# Patient Record
Sex: Female | Born: 1995 | Race: White | Hispanic: No | Marital: Married | Smoking: Former smoker
Health system: Southern US, Community
[De-identification: ages and names within clinical notes are randomized; demographics above are authoritative.]

## PROBLEM LIST (undated history)

## (undated) DIAGNOSIS — L709 Acne, unspecified: Secondary | ICD-10-CM

## (undated) DIAGNOSIS — F32A Depression, unspecified: Secondary | ICD-10-CM

## (undated) DIAGNOSIS — F419 Anxiety disorder, unspecified: Secondary | ICD-10-CM

## (undated) HISTORY — PX: BREAST BIOPSY: SHX20

## (undated) HISTORY — DX: Acne, unspecified: L70.9

## (undated) HISTORY — PX: WISDOM TOOTH EXTRACTION: SHX21

---

## 2015-04-13 ENCOUNTER — Other Ambulatory Visit (HOSPITAL_COMMUNITY)
Admission: RE | Admit: 2015-04-13 | Discharge: 2015-04-13 | Disposition: A | Discharge status: Home / Self Care | Payer: Self-pay | Source: Ambulatory Visit | Attending: Gynecology | Admitting: Gynecology

## 2015-04-13 ENCOUNTER — Ambulatory Visit (INDEPENDENT_AMBULATORY_CARE_PROVIDER_SITE_OTHER): Payer: 59 | Admitting: Women's Health

## 2015-04-13 ENCOUNTER — Encounter: Payer: Self-pay | Admitting: Women's Health

## 2015-04-13 VITALS — BP 124/80 | Ht 63.0 in | Wt 122.0 lb

## 2015-04-13 DIAGNOSIS — Z30018 Encounter for initial prescription of other contraceptives: Secondary | ICD-10-CM

## 2015-04-13 DIAGNOSIS — Z01419 Encounter for gynecological examination (general) (routine) without abnormal findings: Secondary | ICD-10-CM

## 2015-04-13 DIAGNOSIS — N946 Dysmenorrhea, unspecified: Secondary | ICD-10-CM

## 2015-04-13 DIAGNOSIS — Z23 Encounter for immunization: Secondary | ICD-10-CM

## 2015-04-13 HISTORY — DX: Dysmenorrhea, unspecified: N94.6

## 2015-04-13 MED ORDER — ETONOGESTREL-ETHINYL ESTRADIOL 0.12-0.015 MG/24HR VA RING: VAGINAL_RING | VAGINAL | Status: DC

## 2015-04-13 NOTE — Patient Instructions (Signed)
Health Maintenance, Female Adopting a healthy lifestyle and getting preventive care can go a long way to promote health and wellness. Talk with your health care provider about what schedule of regular examinations is right for you. This is a good chance for you to check in with your provider about disease prevention and staying healthy. In between checkups, there are plenty of things you can do on your own. Experts have done a lot of research about which lifestyle changes and preventive measures are most likely to keep you healthy. Ask your health care provider for more information. WEIGHT AND DIET  Eat a healthy diet  Be sure to include plenty of vegetables, fruits, low-fat dairy products, and lean protein.  Do not eat a lot of foods high in solid fats, added sugars, or salt.  Get regular exercise. This is one of the most important things you can do for your health.  Most adults should exercise for at least 150 minutes each week. The exercise should increase your heart rate and make you sweat (moderate-intensity exercise).  Most adults should also do strengthening exercises at least twice a week. This is in addition to the moderate-intensity exercise.  Maintain a healthy weight  Body mass index (BMI) is a measurement that can be used to identify possible weight problems. It estimates body fat based on height and weight. Your health care provider can help determine your BMI and help you achieve or maintain a healthy weight.  For females 20 years of age and older:   A BMI below 18.5 is considered underweight.  A BMI of 18.5 to 24.9 is normal.  A BMI of 25 to 29.9 is considered overweight.  A BMI of 30 and above is considered obese.  Watch levels of cholesterol and blood lipids  You should start having your blood tested for lipids and cholesterol at 22 years of age, then have this test every 5 years.  You may need to have your cholesterol levels checked more often if:  Your lipid  or cholesterol levels are high.  You are older than 22 years of age.  You are at high risk for heart disease.  CANCER SCREENING   Lung Cancer  Lung cancer screening is recommended for adults 55-80 years old who are at high risk for lung cancer because of a history of smoking.  A yearly low-dose CT scan of the lungs is recommended for people who:  Currently smoke.  Have quit within the past 15 years.  Have at least a 30-pack-year history of smoking. A pack year is smoking an average of one pack of cigarettes a day for 1 year.  Yearly screening should continue until it has been 15 years since you quit.  Yearly screening should stop if you develop a health problem that would prevent you from having lung cancer treatment.  Breast Cancer  Practice breast self-awareness. This means understanding how your breasts normally appear and feel.  It also means doing regular breast self-exams. Let your health care provider know about any changes, no matter how small.  If you are in your 20s or 30s, you should have a clinical breast exam (CBE) by a health care provider every 1-3 years as part of a regular health exam.  If you are 40 or older, have a CBE every year. Also consider having a breast X-ray (mammogram) every year.  If you have a family history of breast cancer, talk to your health care provider about genetic screening.  If you   are at high risk for breast cancer, talk to your health care provider about having an MRI and a mammogram every year.  Breast cancer gene (BRCA) assessment is recommended for women who have family members with BRCA-related cancers. BRCA-related cancers include:  Breast.  Ovarian.  Tubal.  Peritoneal cancers.  Results of the assessment will determine the need for genetic counseling and BRCA1 and BRCA2 testing. Cervical Cancer Your health care provider may recommend that you be screened regularly for cancer of the pelvic organs (ovaries, uterus, and  vagina). This screening involves a pelvic examination, including checking for microscopic changes to the surface of your cervix (Pap test). You may be encouraged to have this screening done every 3 years, beginning at age 21.  For women ages 30-65, health care providers may recommend pelvic exams and Pap testing every 3 years, or they may recommend the Pap and pelvic exam, combined with testing for human papilloma virus (HPV), every 5 years. Some types of HPV increase your risk of cervical cancer. Testing for HPV may also be done on women of any age with unclear Pap test results.  Other health care providers may not recommend any screening for nonpregnant women who are considered low risk for pelvic cancer and who do not have symptoms. Ask your health care provider if a screening pelvic exam is right for you.  If you have had past treatment for cervical cancer or a condition that could lead to cancer, you need Pap tests and screening for cancer for at least 20 years after your treatment. If Pap tests have been discontinued, your risk factors (such as having a new sexual partner) need to be reassessed to determine if screening should resume. Some women have medical problems that increase the chance of getting cervical cancer. In these cases, your health care provider may recommend more frequent screening and Pap tests. Colorectal Cancer  This type of cancer can be detected and often prevented.  Routine colorectal cancer screening usually begins at 22 years of age and continues through 22 years of age.  Your health care provider may recommend screening at an earlier age if you have risk factors for colon cancer.  Your health care provider may also recommend using home test kits to check for hidden blood in the stool.  A small camera at the end of a tube can be used to examine your colon directly (sigmoidoscopy or colonoscopy). This is done to check for the earliest forms of colorectal  cancer.  Routine screening usually begins at age 50.  Direct examination of the colon should be repeated every 5-10 years through 22 years of age. However, you may need to be screened more often if early forms of precancerous polyps or small growths are found. Skin Cancer  Check your skin from head to toe regularly.  Tell your health care provider about any new moles or changes in moles, especially if there is a change in a mole's shape or color.  Also tell your health care provider if you have a mole that is larger than the size of a pencil eraser.  Always use sunscreen. Apply sunscreen liberally and repeatedly throughout the day.  Protect yourself by wearing long sleeves, pants, a wide-brimmed hat, and sunglasses whenever you are outside. HEART DISEASE, DIABETES, AND HIGH BLOOD PRESSURE   High blood pressure causes heart disease and increases the risk of stroke. High blood pressure is more likely to develop in:  People who have blood pressure in the high end   of the normal range (130-139/85-89 mm Hg).  People who are overweight or obese.  People who are African American.  If you are 38-23 years of age, have your blood pressure checked every 3-5 years. If you are 61 years of age or older, have your blood pressure checked every year. You should have your blood pressure measured twice--once when you are at a hospital or clinic, and once when you are not at a hospital or clinic. Record the average of the two measurements. To check your blood pressure when you are not at a hospital or clinic, you can use:  An automated blood pressure machine at a pharmacy.  A home blood pressure monitor.  If you are between 45 years and 39 years old, ask your health care provider if you should take aspirin to prevent strokes.  Have regular diabetes screenings. This involves taking a blood sample to check your fasting blood sugar level.  If you are at a normal weight and have a low risk for diabetes,  have this test once every three years after 22 years of age.  If you are overweight and have a high risk for diabetes, consider being tested at a younger age or more often. PREVENTING INFECTION  Hepatitis B  If you have a higher risk for hepatitis B, you should be screened for this virus. You are considered at high risk for hepatitis B if:  You were born in a country where hepatitis B is common. Ask your health care provider which countries are considered high risk.  Your parents were born in a high-risk country, and you have not been immunized against hepatitis B (hepatitis B vaccine).  You have HIV or AIDS.  You use needles to inject street drugs.  You live with someone who has hepatitis B.  You have had sex with someone who has hepatitis B.  You get hemodialysis treatment.  You take certain medicines for conditions, including cancer, organ transplantation, and autoimmune conditions. Hepatitis C  Blood testing is recommended for:  Everyone born from [birth year] through 1967.  Anyone with known risk factors for hepatitis C. Sexually transmitted infections (STIs)  You should be screened for sexually transmitted infections (STIs) including gonorrhea and chlamydia if:  You are sexually active and are younger than 22 years of age.  You are older than 22 years of age and your health care provider tells you that you are at risk for this type of infection.  Your sexual activity has changed since you were last screened and you are at an increased risk for chlamydia or gonorrhea. Ask your health care provider if you are at risk.  If you do not have HIV, but are at risk, it may be recommended that you take a prescription medicine daily to prevent HIV infection. This is called pre-exposure prophylaxis (PrEP). You are considered at risk if:  You are sexually active and do not regularly use condoms or know the HIV status of your partner(s).  You take drugs by injection.  You are sexually  active with a partner who has HIV. Talk with your health care provider about whether you are at high risk of being infected with HIV. If you choose to begin PrEP, you should first be tested for HIV. You should then be tested every 3 months for as long as you are taking PrEP.  PREGNANCY   If you are premenopausal and you may become pregnant, ask your health care provider about preconception counseling.  If you may  become pregnant, take 400 to 800 micrograms (mcg) of folic acid every day.  If you want to prevent pregnancy, talk to your health care provider about birth control (contraception). OSTEOPOROSIS AND MENOPAUSE   Osteoporosis is a disease in which the bones lose minerals and strength with aging. This can result in serious bone fractures. Your risk for osteoporosis can be identified using a bone density scan.  If you are 39 years of age or older, or if you are at risk for osteoporosis and fractures, ask your health care provider if you should be screened.  Ask your health care provider whether you should take a calcium or vitamin D supplement to lower your risk for osteoporosis.  Menopause may have certain physical symptoms and risks.  Hormone replacement therapy may reduce some of these symptoms and risks. Talk to your health care provider about whether hormone replacement therapy is right for you.  HOME CARE INSTRUCTIONS   Schedule regular health, dental, and eye exams.  Stay current with your immunizations.   Do not use any tobacco products including cigarettes, chewing tobacco, or electronic cigarettes.  If you are pregnant, do not drink alcohol.  If you are breastfeeding, limit how much and how often you drink alcohol.  Limit alcohol intake to no more than 1 drink per day for nonpregnant women. One drink equals 12 ounces of beer, 5 ounces of wine, or 1 ounces of hard liquor.  Do not use street drugs.  Do not share needles.  Ask your health care provider for help if  you need support or information about quitting drugs.  Tell your health care provider if you often feel depressed.  Tell your health care provider if you have ever been abused or do not feel safe at home.   This information is not intended to replace advice given to you by your health care provider. Make sure you discuss any questions you have with your health care provider.   Document Released: 08/29/2010 Document Revised: 03/06/2014 Document Reviewed: 01/15/2013 Elsevier Interactive Patient Education [employer]. Endometriosis Endometriosis is a condition in which the tissue that lines the uterus (endometrium) grows outside of its normal location. The tissue may grow in many locations close to the uterus, but it commonly grows on the ovaries, fallopian tubes, vagina, or bowel. Because the uterus expels, or sheds, its lining every menstrual cycle, there is bleeding wherever the endometrial tissue is located. This can cause pain because blood is irritating to tissues not normally exposed to it.  CAUSES  The cause of endometriosis is not known.  SIGNS AND SYMPTOMS  Often, there are no symptoms. When symptoms are present, they can vary with the location of the displaced tissue. Various symptoms can occur at different times. Although symptoms occur mainly during a woman's menstrual period, they can also occur midcycle and usually stop with menopause. Some people may go months with no symptoms at all. Symptoms may include:   Back or abdominal pain.   Heavier bleeding during periods.   Pain during intercourse.   Painful bowel movements.   Infertility. DIAGNOSIS  Your health care provider will do a physical exam and ask about your symptoms. Various tests may be done, such as:   Blood tests and urine tests. These are done to help rule out other problems.   Ultrasound. This test is done to look for abnormal tissue.   An X-ray of the lower bowel (barium enema).  Laparoscopy.  In this procedure, a thin, lighted tube  with a tiny camera on the end (laparoscope) is inserted into your abdomen. This helps your health care provider look for abnormal tissue to confirm the diagnosis. The health care provider may also remove a small piece of tissue (biopsy) from any abnormal tissue found. This tissue sample can then be sent to a lab so it can be looked at under a microscope. TREATMENT  Treatment will vary and may include:   Medicines to relieve pain. Nonsteroidal anti-inflammatory drugs (NSAIDs) are a type of pain medicine that can help to relieve the pain caused by endometriosis.  Hormonal therapy. When using hormonal therapy, periods are eliminated. This eliminates the monthly exposure to blood by the displaced endometrial tissue.   Surgery. Surgery may sometimes be done to remove the abnormal endometrial tissue. In severe cases, surgery may be done to remove the fallopian tubes, uterus, and ovaries (hysterectomy). HOME CARE INSTRUCTIONS   Take all medicines as directed by your health care provider. Do not take aspirin because it may increase bleeding when you are not on hormonal therapy.   Avoid activities that produce pain, including sexual activity. SEEK MEDICAL CARE IF:  You have pelvic pain before, after, or during your periods.  You have pelvic pain between periods that gets worse during your period.  You have pelvic pain during or after sex.  You have pelvic pain with bowel movements or urination, especially during your period.  You have problems getting pregnant.  You have a fever. SEEK IMMEDIATE MEDICAL CARE IF:   Your pain is severe and is not responding to pain medicine.   You have severe nausea and vomiting, or you cannot keep foods down.   You have pain that is limited to the right lower part of your abdomen.   You have swelling or increasing pain in your abdomen.   You see blood in your stool.  MAKE SURE YOU:   Understand these  instructions.  Will watch your condition.  Will get help right away if you are not doing well or get worse.   This information is not intended to replace advice given to you by your health care provider. Make sure you discuss any questions you have with your health care provider.   Document Released: 02/11/2000 Document Revised: 03/06/2014 Document Reviewed: 10/11/2012 Elsevier Interactive Patient Education 2016 [employer]. HPV (Human Papillomavirus) Vaccine--Gardasil-9:  1. Why get vaccinated? Gardasil-9 prevents human papillomavirus (HPV) types that cause many cancers, including:  cervical cancer in females,  vaginal and vulvar cancers in females,  anal cancer in females and males,  throat cancer in females and males, and  penile cancer in males. In addition, Gardasil-9 prevents HPV types that cause genital warts in both females and males. In the [place] about 12,000 women get cervical cancer every year, and about 4,000 women die from it. Gaylyn Lambert can prevent most of these cases of cervical cancer. Vaccination is not a substitute for cervical cancer screening. This vaccine does not protect against all HPV types that can cause cervical cancer. Women should still get regular Pap tests. HPV infection usually comes from sexual contact, and most people will become infected at some point in their life. About 14 million Americans, including teens, get infected every year. Most infections will go away and not cause serious problems. But thousands of women and men get cancer and diseases from HPV. 2. HPV vaccine Gaylyn Lambert is an FDA-approved HPV vaccine. It is recommended for both males and females. It is routinely given at 11 or 22  years of age, but it may be given beginning at age 41 years through age 107 years. Three doses of Gardasil-9 are recommended with the second dose given 1-2 months after the first dose and the third dose given 6 months after the first dose. 3. Some people should  not get this vaccine  Anyone who has had a severe, life-threatening allergic reaction to a dose of HPV vaccine should not get another dose.  Anyone who has a severe (life threatening) allergy to any component of HPV vaccine should not get the vaccine. Tell your doctor if you have any severe allergies that you know of, including a severe allergy to yeast.  HPV vaccine is not recommended for pregnant women. If you learn that you were pregnant when you were vaccinated, there is no reason to expect any problems for you or your baby. Any woman who learns she was pregnant when she got Gardasil-9 vaccine is encouraged to contact the manufacturer's registry for HPV vaccination during pregnancy at 3176423676. Women who are breastfeeding may be vaccinated.  If you have a mild illness, such as a cold, you can probably get the vaccine today. If you are moderately or severely ill, you should probably wait until you recover. Your doctor can advise you. 4. Risks of a vaccine reaction With any medicine, including vaccines, there is a chance of side effects. These are usually mild and go away on their own, but serious reactions are also possible. Most people who get HPV vaccine do not have any serious problems with it. Mild or moderate problems following Gardasil-9:  Reactions in the arm where the shot was given:  Soreness (about 9 people in 10)  Redness or swelling (about 1 person in 3)  Fever:  Mild (100F) (about 1 person in 10)  Moderate (102F) (about 1 person in 65)  Other problems:  Headache (about 1 person in 3) Problems that could happen after any injected vaccine:  People sometimes faint after a medical procedure, including vaccination. Sitting or lying down for about 15 minutes can help prevent fainting, and injuries caused by a fall. Tell your doctor if you feel dizzy, or have vision changes or ringing in the ears.  Some people get severe pain in the shoulder and have difficulty  moving the arm where a shot was given. This happens very rarely.  Any medication can cause a severe allergic reaction. Such reactions from a vaccine are very rare, estimated at about 1 in a million doses, and would happen within a few minutes to a few hours after the vaccination. As with any medicine, there is a very remote chance of a vaccine causing a serious injury or death. The safety of vaccines is always being monitored. For more information, visit: http://floyd.org/. 5. What if there is a serious reaction? What should I look for? Look for anything that concerns you, such as signs of a severe allergic reaction, very high fever, or unusual behavior. Signs of a severe allergic reaction can include hives, swelling of the face and throat, difficulty breathing, a fast heartbeat, dizziness, and weakness. These would usually start a few minutes to a few hours after the vaccination. What should I do? If you think it is a severe allergic reaction or other emergency that can't wait, call 9-1-1 or get to the nearest hospital. Otherwise, call your doctor. Afterward, the reaction should be reported to the "Vaccine Adverse Event Reporting System" (VAERS). Your doctor might file this report, or you can do it yourself  through the VAERS web site at www.vaers.SamedayNews.es, or by calling 4355290236. VAERS does not give medical advice. 6. The National Vaccine Injury Compensation Program The [employer] Vaccine Injury Compensation Program (VICP) is a federal program that was created to compensate people who may have been injured by certain vaccines. Persons who believe they may have been injured by a vaccine can learn about the program and about filing a claim by calling 8078578572 or visiting the [place] website at GoldCloset.com.ee. There is a time limit to file a claim for compensation. 7. How can I learn more?  Ask your health care provider. He or she can give you the vaccine package  insert or suggest other sources of information.  Call your local or state health department.  Contact the Centers for Disease Control and Prevention (CDC):  Call (816) 567-8470 (1-800-CDC-INFO) or  Visit CDC's website at http://sweeney-todd.com/ Vaccine Information Statement HPV Vaccine Gaylyn Lambert) 05/28/14   This information is not intended to replace advice given to you by your health care provider. Make sure you discuss any questions you have with your health care provider.   Document Released: 09/10/2013 Document Revised: 06/30/2014 Document Reviewed: 09/10/2013 Elsevier Interactive Patient Education [employer].

## 2015-04-13 NOTE — Progress Notes (Signed)
Gina Shepherd [birth year] [id number]    History:    Presents for annual exam.  History of regular monthly cycles was started on NuvaRing in September by  family doctor due to dysmenorrhea and rectal pain with menstrual cycles. Primary care felt she may have endometriosis. Virgin. Has had some relief of discomfort with continuous NuvaRing but has had some problems with spotting and questions if that is okay to have spotting. Has not received gardasil. Reports normal labs at primary care in September.  Past medical history, past surgical history, family history and social history were all reviewed and documented in the EPIC chart. 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 Parents healthy.  ROS:  A ROS was performed and pertinent positives and negatives are included.  Exam:  Filed Vitals:   04/13/15 1138  BP: 124/80    General appearance:  Normal Thyroid:  Symmetrical, normal in size, without palpable masses or nodularity. Respiratory  Auscultation:  Clear without wheezing or rhonchi Cardiovascular  Auscultation:  Regular rate, without rubs, murmurs or gallops  Edema/varicosities:  Not grossly evident Abdominal  Soft,nontender, without masses, guarding or rebound.  Liver/spleen:  No organomegaly noted  Hernia:  None appreciated  Skin  Inspection:  Grossly normal   Breasts: Examined lying and sitting.     Right: Without masses, retractions, discharge or axillary adenopathy.     Left: Without masses, retractions, discharge or axillary adenopathy. Gentitourinary   Inguinal/mons:  Normal without inguinal adenopathy  External genitalia:  Normal  BUS/Urethra/Skene's glands:  Normal  Vagina:  Normal  menses type blood  Cervix:  Normal  Uterus:   normal in size, shape and contour.  Midline and mobile  Adnexa/parametria:     Rt: Without masses or tenderness.   Lt: Without masses or tenderness.  Anus and perineum: Normal  Digital rectal exam: Normal sphincter tone without palpated masses or  tenderness  Assessment/Plan:  20 y.o. S WF Virgin for annual exam.    Dysmenorrhea with rectal pain/possible endometriosis Gardasil  Plan: Gardasil information given and reviewed first given instructed to schedule second in 2 months reviewed it is a 3 series injection. Endometriosis reviewed, best way to diagnose is laparoscopic surgery. Reviewed best to try medications initially will continue with the NuvaRing take continuously, if spotting becomes too problematic stop for 3-4 days have a cycle and replace. Reviewed importance of condoms if sexually active. SBE's, exercise, calcium rich diet, MVI daily encouraged. Campus safety reviewed. UA, Pap.    Harrington Challenger WHNP, 1:00 PM 04/13/2015

## 2015-04-14 LAB — URINALYSIS W MICROSCOPIC + REFLEX CULTURE
Bilirubin Urine: NEGATIVE
CASTS: NONE SEEN [LPF]
CRYSTALS: NONE SEEN [HPF]
Glucose, UA: NEGATIVE
Ketones, ur: NEGATIVE
Leukocytes, UA: NEGATIVE
Nitrite: NEGATIVE
Specific Gravity, Urine: 1.014 (ref 1.001–1.035)
WBC, UA: NONE SEEN WBC/HPF (ref <=5)
YEAST: NONE SEEN [HPF]
pH: 8 (ref 5.0–8.0)

## 2015-04-15 ENCOUNTER — Other Ambulatory Visit: Payer: Self-pay | Admitting: *Deleted

## 2015-04-15 LAB — CYTOLOGY - PAP

## 2015-04-15 MED ORDER — SULFAMETHOXAZOLE-TRIMETHOPRIM 800-160 MG PO TABS: 1.0000 | ORAL_TABLET | Freq: Two times a day (BID) | ORAL | Status: DC

## 2015-04-16 LAB — URINE CULTURE

## 2015-06-10 ENCOUNTER — Ambulatory Visit (INDEPENDENT_AMBULATORY_CARE_PROVIDER_SITE_OTHER): Payer: 59 | Admitting: *Deleted

## 2015-06-10 DIAGNOSIS — Z23 Encounter for immunization: Secondary | ICD-10-CM

## 2016-01-13 ENCOUNTER — Encounter (HOSPITAL_COMMUNITY): Payer: Self-pay | Admitting: Emergency Medicine

## 2016-01-13 ENCOUNTER — Emergency Department (HOSPITAL_COMMUNITY)
Admission: EM | Admit: 2016-01-13 | Discharge: 2016-01-13 | Disposition: A | Discharge status: Home / Self Care | Payer: Self-pay | Attending: Emergency Medicine | Admitting: Emergency Medicine

## 2016-01-13 DIAGNOSIS — Z87891 Personal history of nicotine dependence: Secondary | ICD-10-CM | POA: Insufficient documentation

## 2016-01-13 DIAGNOSIS — W4904XA Ring or other jewelry causing external constriction, initial encounter: Secondary | ICD-10-CM

## 2016-01-13 DIAGNOSIS — M79644 Pain in right finger(s): Secondary | ICD-10-CM | POA: Insufficient documentation

## 2016-01-13 DIAGNOSIS — S60449A External constriction of unspecified finger, initial encounter: Secondary | ICD-10-CM

## 2016-01-13 NOTE — ED Provider Notes (Signed)
[facility]-EMERGENCY DEPT Provider Note   CSN: [id number] Arrival date & time: 01/13/16  2223     History   Chief [id number] Chief Complaint  Patient presents with  . Hand Pain    HPI Gina Shepherd is a 20 y.o. female.  HPI Patient states she placed a ring she normally wears on her right fourth digit on her third fourth digit this morning. She was unable to remove the ring. No swelling or trauma. Patient attempted to use multiple lubricants but was unable to remove the ring. History reviewed. No pertinent past medical history.  Patient Active Problem List   Diagnosis Date Noted  . Dysmenorrhea 04/13/2015    Past Surgical History:  Procedure Laterality Date  . WISDOM TOOTH EXTRACTION      OB History    Gravida Para Term Preterm AB Living   0 0 0 0 0 0   SAB TAB Ectopic Multiple Live Births   0 0 0 0         Home Medications    Prior to Admission medications   Medication Sig Start Date End Date Taking? Authorizing Provider  Adapalene-Benzoyl Peroxide (EPIDUO) 0.1-2.5 % gel Apply topically as directed.    Historical Provider, MD  etonogestrel-ethinyl estradiol (NUVARING) 0.12-0.015 MG/24HR vaginal ring Insert vaginally and leave in place for 4 consecutive weeks, replace, if spotting > 2days remove for 3-4 days 04/13/15   Harrington ChallengerNancy J Young, NP  sulfamethoxazole-trimethoprim (BACTRIM DS,SEPTRA DS) 800-160 MG tablet Take 1 tablet by mouth 2 (two) times daily. 04/15/15   Harrington ChallengerNancy J Young, NP    Family History Family History  Problem Relation Age of Onset  . High Cholesterol Maternal Grandmother   . Endometriosis      Social History Social History  Substance Use Topics  . Smoking status: Former Games developermoker  . Smokeless tobacco: Never Used  . Alcohol use 0.0 oz/week     Comment: rare     Allergies   Patient has no known allergies.   Review of Systems Review of Systems  Skin: Negative for wound.  Neurological: Negative for numbness.  All other systems reviewed and are  negative.    Physical Exam Updated Vital Signs BP 138/84 (BP Location: Left Arm)   Pulse 60   Temp 97.8 F (36.6 C) (Oral)   Resp 16   Ht 5' 3.75" (1.619 m)   Wt 120 lb (54.4 kg)   LMP 12/26/2015 (Approximate)   SpO2 100%   BMI 20.76 kg/m   Physical Exam  Constitutional: She is oriented to person, place, and time. She appears well-developed and well-nourished.  HENT:  Head: Atraumatic.  Eyes: Pupils are equal, round, and reactive to light.  Neck: Normal range of motion.  Cardiovascular: Normal rate.   Pulmonary/Chest: Effort normal.  Abdominal: Soft. There is no tenderness. There is no rebound and no guarding.  Musculoskeletal: Normal range of motion. She exhibits no edema, tenderness or deformity.  Ring has been cut off by staff. Patient with no swelling, erythema or deformity of the third digit of the right hand. Full flexion and extension of the third digit. Good distal cap refill  Neurological: She is alert and oriented to person, place, and time.  Sensation intact. No motor deficits of the right hand.  Skin: Skin is warm and dry. Capillary refill takes less than 2 seconds. No rash noted. No erythema.  Psychiatric: She has a normal mood and affect. Her behavior is normal.  Nursing note and vitals reviewed.  ED Treatments / Results  Labs (all labs ordered are listed, but only abnormal results are displayed) Labs Reviewed - No data to display  EKG  EKG Interpretation None       Radiology No results found.  Procedures Procedures (including critical care time)  Medications Ordered in ED Medications - No data to display   Initial Impression / Assessment and Plan / ED Course  I have reviewed the triage vital signs and the nursing notes.  Pertinent labs & imaging results that were available during my care of the patient were reviewed by me and considered in my medical decision making (see chart for details).  Clinical Course     Ring was removed in  the emergency department. No obvious injury. Do not believe that imaging is necessary. Will discharge home.  Final Clinical Impressions(s) / ED Diagnoses   Final diagnoses:  Tight ring on finger    New Prescriptions New Prescriptions   No medications on file     Loren Raceravid Emberlynn Riggan, MD 01/13/16 2326

## 2016-01-13 NOTE — ED Triage Notes (Signed)
Pt has a ring stuck on her right hand middle finger  Pt states she has tried to get it off with soap, butter, ice, etc and cannot get it off

## 2016-01-13 NOTE — ED Notes (Signed)
Patient d/c'd self care.  F/U reviewed.  Patient verbalized understanding. 

## 2016-01-13 NOTE — ED Notes (Signed)
Patient left without signing D/C papers.

## 2016-04-14 ENCOUNTER — Encounter: Payer: Self-pay | Admitting: Women's Health

## 2016-04-14 ENCOUNTER — Ambulatory Visit (INDEPENDENT_AMBULATORY_CARE_PROVIDER_SITE_OTHER): Payer: 59 | Admitting: Women's Health

## 2016-04-14 VITALS — BP 120/70 | Ht 64.5 in | Wt 123.0 lb

## 2016-04-14 DIAGNOSIS — Z23 Encounter for immunization: Secondary | ICD-10-CM | POA: Diagnosis not present

## 2016-04-14 DIAGNOSIS — F3289 Other specified depressive episodes: Secondary | ICD-10-CM | POA: Diagnosis not present

## 2016-04-14 DIAGNOSIS — Z01419 Encounter for gynecological examination (general) (routine) without abnormal findings: Secondary | ICD-10-CM | POA: Diagnosis not present

## 2016-04-14 DIAGNOSIS — Z30018 Encounter for initial prescription of other contraceptives: Secondary | ICD-10-CM

## 2016-04-14 LAB — URINALYSIS W MICROSCOPIC + REFLEX CULTURE
BILIRUBIN URINE: NEGATIVE
Bacteria, UA: NONE SEEN [HPF]
CRYSTALS: NONE SEEN [HPF]
Casts: NONE SEEN [LPF]
GLUCOSE, UA: NEGATIVE
Hgb urine dipstick: NEGATIVE
KETONES UR: NEGATIVE
Leukocytes, UA: NEGATIVE
Nitrite: NEGATIVE
Protein, ur: NEGATIVE
Specific Gravity, Urine: 1.023 (ref 1.001–1.035)
WBC UA: NONE SEEN WBC/HPF (ref <=5)
Yeast: NONE SEEN [HPF]
pH: 7 (ref 5.0–8.0)

## 2016-04-14 NOTE — Patient Instructions (Signed)
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
## 2016-04-16 LAB — URINE CULTURE

## 2016-06-27 DIAGNOSIS — L709 Acne, unspecified: Secondary | ICD-10-CM

## 2016-06-27 HISTORY — DX: Acne, unspecified: L70.9

## 2017-04-17 ENCOUNTER — Ambulatory Visit (INDEPENDENT_AMBULATORY_CARE_PROVIDER_SITE_OTHER): Payer: Self-pay | Admitting: Women's Health

## 2017-04-17 ENCOUNTER — Encounter: Payer: Self-pay | Admitting: Women's Health

## 2017-04-17 VITALS — BP 110/76 | Ht 65.4 in | Wt 131.0 lb

## 2017-04-17 DIAGNOSIS — Z01419 Encounter for gynecological examination (general) (routine) without abnormal findings: Secondary | ICD-10-CM

## 2017-04-17 MED ORDER — ESCITALOPRAM OXALATE 10 MG PO TABS: 10.0000 mg | ORAL_TABLET | Freq: Every day | ORAL | 4 refills | Status: DC
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
## 2017-04-17 NOTE — Patient Instructions (Signed)

## 2017-06-05 ENCOUNTER — Other Ambulatory Visit: Payer: Self-pay | Admitting: Physician Assistant

## 2017-06-05 DIAGNOSIS — R202 Paresthesia of skin: Secondary | ICD-10-CM

## 2017-06-11 ENCOUNTER — Ambulatory Visit
Admission: RE | Admit: 2017-06-11 | Discharge: 2017-06-11 | Disposition: A | Discharge status: Home / Self Care | Payer: Self-pay | Source: Ambulatory Visit | Attending: Physician Assistant | Admitting: Physician Assistant

## 2017-06-11 DIAGNOSIS — R202 Paresthesia of skin: Secondary | ICD-10-CM

## 2017-06-11 MED ORDER — GADOBENATE DIMEGLUMINE 529 MG/ML IV SOLN
10.0000 mL | Freq: Once | INTRAVENOUS | Status: AC | PRN
Start: 2017-06-11 — End: 2017-06-11
  Administered 2017-06-11: 10 mL via INTRAVENOUS

## 2017-08-28 DIAGNOSIS — F411 Generalized anxiety disorder: Secondary | ICD-10-CM | POA: Diagnosis not present

## 2017-09-11 DIAGNOSIS — F411 Generalized anxiety disorder: Secondary | ICD-10-CM | POA: Diagnosis not present

## 2017-09-25 DIAGNOSIS — F411 Generalized anxiety disorder: Secondary | ICD-10-CM | POA: Diagnosis not present

## 2017-09-26 ENCOUNTER — Other Ambulatory Visit: Payer: Self-pay | Admitting: Family Medicine

## 2017-09-26 ENCOUNTER — Ambulatory Visit
Admission: RE | Admit: 2017-09-26 | Discharge: 2017-09-26 | Disposition: A | Discharge status: Home / Self Care | Payer: BLUE CROSS/BLUE SHIELD | Source: Ambulatory Visit | Attending: Family Medicine | Admitting: Family Medicine

## 2017-09-26 DIAGNOSIS — T1490XA Injury, unspecified, initial encounter: Secondary | ICD-10-CM

## 2017-09-26 DIAGNOSIS — S99912A Unspecified injury of left ankle, initial encounter: Secondary | ICD-10-CM | POA: Diagnosis not present

## 2017-09-26 DIAGNOSIS — M25572 Pain in left ankle and joints of left foot: Secondary | ICD-10-CM | POA: Diagnosis not present

## 2017-09-26 DIAGNOSIS — M7989 Other specified soft tissue disorders: Secondary | ICD-10-CM | POA: Diagnosis not present

## 2017-09-26 DIAGNOSIS — S93402A Sprain of unspecified ligament of left ankle, initial encounter: Secondary | ICD-10-CM | POA: Diagnosis not present

## 2017-10-09 DIAGNOSIS — F411 Generalized anxiety disorder: Secondary | ICD-10-CM | POA: Diagnosis not present

## 2017-11-07 DIAGNOSIS — F411 Generalized anxiety disorder: Secondary | ICD-10-CM | POA: Diagnosis not present

## 2017-11-07 DIAGNOSIS — F339 Major depressive disorder, recurrent, unspecified: Secondary | ICD-10-CM | POA: Diagnosis not present

## 2017-11-26 DIAGNOSIS — F411 Generalized anxiety disorder: Secondary | ICD-10-CM | POA: Diagnosis not present

## 2017-11-26 DIAGNOSIS — F339 Major depressive disorder, recurrent, unspecified: Secondary | ICD-10-CM | POA: Diagnosis not present

## 2017-11-28 ENCOUNTER — Other Ambulatory Visit: Payer: Self-pay | Admitting: Dermatology

## 2017-11-28 DIAGNOSIS — D485 Neoplasm of uncertain behavior of skin: Secondary | ICD-10-CM | POA: Diagnosis not present

## 2017-11-28 DIAGNOSIS — L709 Acne, unspecified: Secondary | ICD-10-CM | POA: Diagnosis not present

## 2017-12-12 DIAGNOSIS — F411 Generalized anxiety disorder: Secondary | ICD-10-CM | POA: Diagnosis not present

## 2017-12-12 DIAGNOSIS — F339 Major depressive disorder, recurrent, unspecified: Secondary | ICD-10-CM | POA: Diagnosis not present

## 2018-01-02 DIAGNOSIS — F339 Major depressive disorder, recurrent, unspecified: Secondary | ICD-10-CM | POA: Diagnosis not present

## 2018-01-02 DIAGNOSIS — F411 Generalized anxiety disorder: Secondary | ICD-10-CM | POA: Diagnosis not present

## 2018-01-07 DIAGNOSIS — L739 Follicular disorder, unspecified: Secondary | ICD-10-CM | POA: Diagnosis not present

## 2018-01-09 DIAGNOSIS — F411 Generalized anxiety disorder: Secondary | ICD-10-CM | POA: Diagnosis not present

## 2018-01-09 DIAGNOSIS — F339 Major depressive disorder, recurrent, unspecified: Secondary | ICD-10-CM | POA: Diagnosis not present

## 2018-01-16 DIAGNOSIS — H52223 Regular astigmatism, bilateral: Secondary | ICD-10-CM | POA: Diagnosis not present

## 2018-01-17 DIAGNOSIS — F411 Generalized anxiety disorder: Secondary | ICD-10-CM | POA: Diagnosis not present

## 2018-01-17 DIAGNOSIS — F339 Major depressive disorder, recurrent, unspecified: Secondary | ICD-10-CM | POA: Diagnosis not present

## 2018-01-30 DIAGNOSIS — F411 Generalized anxiety disorder: Secondary | ICD-10-CM | POA: Diagnosis not present

## 2018-01-30 DIAGNOSIS — F339 Major depressive disorder, recurrent, unspecified: Secondary | ICD-10-CM | POA: Diagnosis not present

## 2018-02-07 DIAGNOSIS — F411 Generalized anxiety disorder: Secondary | ICD-10-CM | POA: Diagnosis not present

## 2018-02-07 DIAGNOSIS — F339 Major depressive disorder, recurrent, unspecified: Secondary | ICD-10-CM | POA: Diagnosis not present

## 2018-02-15 DIAGNOSIS — F339 Major depressive disorder, recurrent, unspecified: Secondary | ICD-10-CM | POA: Diagnosis not present

## 2018-02-15 DIAGNOSIS — F411 Generalized anxiety disorder: Secondary | ICD-10-CM | POA: Diagnosis not present

## 2018-02-25 DIAGNOSIS — F411 Generalized anxiety disorder: Secondary | ICD-10-CM | POA: Diagnosis not present

## 2018-02-25 DIAGNOSIS — F339 Major depressive disorder, recurrent, unspecified: Secondary | ICD-10-CM | POA: Diagnosis not present

## 2018-03-06 DIAGNOSIS — F411 Generalized anxiety disorder: Secondary | ICD-10-CM | POA: Diagnosis not present

## 2018-03-06 DIAGNOSIS — F339 Major depressive disorder, recurrent, unspecified: Secondary | ICD-10-CM | POA: Diagnosis not present

## 2018-03-20 DIAGNOSIS — F411 Generalized anxiety disorder: Secondary | ICD-10-CM | POA: Diagnosis not present

## 2018-03-20 DIAGNOSIS — F339 Major depressive disorder, recurrent, unspecified: Secondary | ICD-10-CM | POA: Diagnosis not present

## 2018-03-28 DIAGNOSIS — R109 Unspecified abdominal pain: Secondary | ICD-10-CM | POA: Diagnosis not present

## 2018-03-28 DIAGNOSIS — F419 Anxiety disorder, unspecified: Secondary | ICD-10-CM | POA: Diagnosis not present

## 2018-04-04 DIAGNOSIS — F339 Major depressive disorder, recurrent, unspecified: Secondary | ICD-10-CM | POA: Diagnosis not present

## 2018-04-04 DIAGNOSIS — F411 Generalized anxiety disorder: Secondary | ICD-10-CM | POA: Diagnosis not present

## 2018-04-17 DIAGNOSIS — F411 Generalized anxiety disorder: Secondary | ICD-10-CM | POA: Diagnosis not present

## 2018-04-17 DIAGNOSIS — F339 Major depressive disorder, recurrent, unspecified: Secondary | ICD-10-CM | POA: Diagnosis not present

## 2018-04-22 ENCOUNTER — Encounter: Payer: Self-pay | Admitting: Women's Health

## 2018-04-22 ENCOUNTER — Ambulatory Visit: Payer: BLUE CROSS/BLUE SHIELD | Admitting: Women's Health

## 2018-04-22 VITALS — BP 122/78 | Ht 65.0 in | Wt 141.0 lb

## 2018-04-22 DIAGNOSIS — Z01419 Encounter for gynecological examination (general) (routine) without abnormal findings: Secondary | ICD-10-CM | POA: Diagnosis not present

## 2018-04-22 MED ORDER — ESCITALOPRAM OXALATE 10 MG PO TABS: 10.0000 mg | ORAL_TABLET | Freq: Every day | ORAL | 4 refills | Status: DC
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
## 2018-04-22 NOTE — Patient Instructions (Signed)

## 2018-04-23 LAB — PAP IG W/ RFLX HPV ASCU

## 2018-05-02 DIAGNOSIS — F411 Generalized anxiety disorder: Secondary | ICD-10-CM | POA: Diagnosis not present

## 2018-05-02 DIAGNOSIS — F339 Major depressive disorder, recurrent, unspecified: Secondary | ICD-10-CM | POA: Diagnosis not present

## 2018-07-10 ENCOUNTER — Telehealth: Payer: Self-pay

## 2018-07-10 NOTE — Telephone Encounter (Signed)
Patient complained of questionable UTI or vaginal infection. Reports some intermittent vag bleeding and dark urine first thing in the morning with "a burning in my low abdomen".  Sx began a few weeks ago and worsened in the last several days. Recommended office visit to assess. Appt scheduled for in the morning.

## 2018-07-11 ENCOUNTER — Encounter: Payer: Self-pay | Admitting: Obstetrics & Gynecology

## 2018-07-11 ENCOUNTER — Other Ambulatory Visit: Payer: Self-pay

## 2018-07-11 ENCOUNTER — Ambulatory Visit: Payer: BLUE CROSS/BLUE SHIELD | Admitting: Obstetrics & Gynecology

## 2018-07-11 VITALS — BP 122/70

## 2018-07-11 DIAGNOSIS — Z113 Encounter for screening for infections with a predominantly sexual mode of transmission: Secondary | ICD-10-CM

## 2018-07-11 DIAGNOSIS — N921 Excessive and frequent menstruation with irregular cycle: Secondary | ICD-10-CM

## 2018-07-11 DIAGNOSIS — R102 Pelvic and perineal pain: Secondary | ICD-10-CM | POA: Diagnosis not present

## 2018-07-11 DIAGNOSIS — N898 Other specified noninflammatory disorders of vagina: Secondary | ICD-10-CM

## 2018-07-11 DIAGNOSIS — R300 Dysuria: Secondary | ICD-10-CM | POA: Diagnosis not present

## 2018-07-11 LAB — WET PREP FOR TRICH, YEAST, CLUE

## 2018-07-11 NOTE — Addendum Note (Signed)
Addended by: Berna Spare A on: 07/11/2018 02:46 PM   Modules accepted: Orders
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
## 2018-07-12 DIAGNOSIS — F339 Major depressive disorder, recurrent, unspecified: Secondary | ICD-10-CM | POA: Diagnosis not present

## 2018-07-12 DIAGNOSIS — F411 Generalized anxiety disorder: Secondary | ICD-10-CM | POA: Diagnosis not present

## 2018-07-12 LAB — C. TRACHOMATIS/N. GONORRHOEAE RNA
C. trachomatis RNA, TMA: NOT DETECTED
N. gonorrhoeae RNA, TMA: NOT DETECTED

## 2018-07-12 LAB — URINALYSIS, COMPLETE W/RFL CULTURE
Bacteria, UA: NONE SEEN /HPF
Bilirubin Urine: NEGATIVE
Glucose, UA: NEGATIVE
Hgb urine dipstick: NEGATIVE
Hyaline Cast: NONE SEEN /LPF
Ketones, ur: NEGATIVE
Leukocyte Esterase: NEGATIVE
Nitrites, Initial: NEGATIVE
Protein, ur: NEGATIVE
RBC / HPF: NONE SEEN /HPF (ref 0–2)
Specific Gravity, Urine: 1.02 (ref 1.001–1.03)
WBC, UA: NONE SEEN /HPF (ref 0–5)
pH: 7 (ref 5.0–8.0)

## 2018-07-12 LAB — NO CULTURE INDICATED

## 2018-07-18 ENCOUNTER — Encounter: Payer: Self-pay | Admitting: *Deleted

## 2018-07-25 DIAGNOSIS — F411 Generalized anxiety disorder: Secondary | ICD-10-CM | POA: Diagnosis not present

## 2018-07-25 DIAGNOSIS — F339 Major depressive disorder, recurrent, unspecified: Secondary | ICD-10-CM | POA: Diagnosis not present

## 2018-08-19 DIAGNOSIS — F339 Major depressive disorder, recurrent, unspecified: Secondary | ICD-10-CM | POA: Diagnosis not present

## 2018-08-19 DIAGNOSIS — F411 Generalized anxiety disorder: Secondary | ICD-10-CM | POA: Diagnosis not present

## 2018-11-29 ENCOUNTER — Telehealth: Payer: Self-pay

## 2018-11-29 NOTE — Telephone Encounter (Signed)
Patient called stating that yesterday at the gym she was doing "split squats"  and felt a pinching feeling at "hip bone toward the center-hard to describe".  She assumed she pulled a muscle. When she got home and wiped she had red spotting and wiped it away with 3 wipes and no more since.  Today she has brownish discharge. She was supposed to ovulate yesterday she said.  I reassured her. Advised if this spotting continues she should schedule office visit. She is not having intercourse and is not concerned about STD or pregnancy.

## 2019-01-03 ENCOUNTER — Other Ambulatory Visit: Payer: Self-pay

## 2019-01-03 DIAGNOSIS — Z20822 Contact with and (suspected) exposure to covid-19: Secondary | ICD-10-CM

## 2019-01-04 LAB — NOVEL CORONAVIRUS, NAA: SARS-CoV-2, NAA: NOT DETECTED

## 2019-04-07 DIAGNOSIS — F411 Generalized anxiety disorder: Secondary | ICD-10-CM | POA: Diagnosis not present

## 2019-04-07 DIAGNOSIS — F339 Major depressive disorder, recurrent, unspecified: Secondary | ICD-10-CM | POA: Diagnosis not present

## 2019-04-25 DIAGNOSIS — Z0000 Encounter for general adult medical examination without abnormal findings: Secondary | ICD-10-CM | POA: Diagnosis not present

## 2019-04-25 DIAGNOSIS — F419 Anxiety disorder, unspecified: Secondary | ICD-10-CM | POA: Diagnosis not present

## 2019-04-30 ENCOUNTER — Other Ambulatory Visit: Payer: Self-pay

## 2019-04-30 ENCOUNTER — Encounter: Payer: Self-pay | Admitting: Women's Health

## 2019-04-30 ENCOUNTER — Ambulatory Visit: Payer: BC Managed Care – PPO | Admitting: Women's Health

## 2019-04-30 VITALS — BP 120/82 | Ht 65.0 in | Wt 129.0 lb

## 2019-04-30 DIAGNOSIS — Z01419 Encounter for gynecological examination (general) (routine) without abnormal findings: Secondary | ICD-10-CM | POA: Diagnosis not present

## 2019-04-30 MED ORDER — IBUPROFEN 600 MG PO TABS: 600.0000 mg | ORAL_TABLET | Freq: Three times a day (TID) | ORAL | 1 refills | Status: DC | PRN
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
## 2019-04-30 NOTE — Patient Instructions (Signed)
Good to see you today Best wishes on your upcoming marriage Multivitamin daily Health Maintenance, Female Adopting a healthy lifestyle and getting preventive care are important in promoting health and wellness. Ask your health care provider about:  The right schedule for you to have regular tests and exams.  Things you can do on your own to prevent diseases and keep yourself healthy. What should I know about diet, weight, and exercise? Eat a healthy diet   Eat a diet that includes plenty of vegetables, fruits, low-fat dairy products, and lean protein.  Do not eat a lot of foods that are high in solid fats, added sugars, or sodium. Maintain a healthy weight Body mass index (BMI) is used to identify weight problems. It estimates body fat based on height and weight. Your health care provider can help determine your BMI and help you achieve or maintain a healthy weight. Get regular exercise Get regular exercise. This is one of the most important things you can do for your health. Most adults should:  Exercise for at least 150 minutes each week. The exercise should increase your heart rate and make you sweat (moderate-intensity exercise).  Do strengthening exercises at least twice a week. This is in addition to the moderate-intensity exercise.  Spend less time sitting. Even light physical activity can be beneficial. Watch cholesterol and blood lipids Have your blood tested for lipids and cholesterol at 26 years of age, then have this test every 5 years. Have your cholesterol levels checked more often if:  Your lipid or cholesterol levels are high.  You are older than 26 years of age.  You are at high risk for heart disease. What should I know about cancer screening? Depending on your health history and family history, you may need to have cancer screening at various ages. This may include screening for:  Breast cancer.  Cervical cancer.  Colorectal cancer.  Skin cancer.  Lung  cancer. What should I know about heart disease, diabetes, and high blood pressure? Blood pressure and heart disease  High blood pressure causes heart disease and increases the risk of stroke. This is more likely to develop in people who have high blood pressure readings, are of African descent, or are overweight.  Have your blood pressure checked: ? Every 3-5 years if you are 36-4 years of age. ? Every year if you are 54 years old or older. Diabetes Have regular diabetes screenings. This checks your fasting blood sugar level. Have the screening done:  Once every three years after age 64 if you are at a normal weight and have a low risk for diabetes.  More often and at a younger age if you are overweight or have a high risk for diabetes. What should I know about preventing infection? Hepatitis B If you have a higher risk for hepatitis B, you should be screened for this virus. Talk with your health care provider to find out if you are at risk for hepatitis B infection. Hepatitis C Testing is recommended for:  Everyone born from [birth year] through 1967.  Anyone with known risk factors for hepatitis C. Sexually transmitted infections (STIs)  Get screened for STIs, including gonorrhea and chlamydia, if: ? You are sexually active and are younger than 26 years of age. ? You are older than 26 years of age and your health care provider tells you that you are at risk for this type of infection. ? Your sexual activity has changed since you were last screened, and you are  at increased risk for chlamydia or gonorrhea. Ask your health care provider if you are at risk.  Ask your health care provider about whether you are at high risk for HIV. Your health care provider may recommend a prescription medicine to help prevent HIV infection. If you choose to take medicine to prevent HIV, you should first get tested for HIV. You should then be tested every 3 months for as long as you are taking the  medicine. Pregnancy  If you are about to stop having your period (premenopausal) and you may become pregnant, seek counseling before you get pregnant.  Take 400 to 800 micrograms (mcg) of folic acid every day if you become pregnant.  Ask for birth control (contraception) if you want to prevent pregnancy. Osteoporosis and menopause Osteoporosis is a disease in which the bones lose minerals and strength with aging. This can result in bone fractures. If you are 48 years old or older, or if you are at risk for osteoporosis and fractures, ask your health care provider if you should:  Be screened for bone loss.  Take a calcium or vitamin D supplement to lower your risk of fractures.  Be given hormone replacement therapy (HRT) to treat symptoms of menopause. Follow these instructions at home: Lifestyle  Do not use any products that contain nicotine or tobacco, such as cigarettes, e-cigarettes, and chewing tobacco. If you need help quitting, ask your health care provider.  Do not use street drugs.  Do not share needles.  Ask your health care provider for help if you need support or information about quitting drugs. Alcohol use  Do not drink alcohol if: ? Your health care provider tells you not to drink. ? You are pregnant, may be pregnant, or are planning to become pregnant.  If you drink alcohol: ? Limit how much you use to 0-1 drink a day. ? Limit intake if you are breastfeeding.  Be aware of how much alcohol is in your drink. In the [place] one drink equals one 12 oz bottle of beer (355 mL), one 5 oz glass of wine (148 mL), or one 1 oz glass of hard liquor (44 mL). General instructions  Schedule regular health, dental, and eye exams.  Stay current with your vaccines.  Tell your health care provider if: ? You often feel depressed. ? You have ever been abused or do not feel safe at home. Summary  Adopting a healthy lifestyle and getting preventive care are important in  promoting health and wellness.  Follow your health care provider's instructions about healthy diet, exercising, and getting tested or screened for diseases.  Follow your health care provider's instructions on monitoring your cholesterol and blood pressure. This information is not intended to replace advice given to you by your health care provider. Make sure you discuss any questions you have with your health care provider. Document Revised: 02/06/2018 Document Reviewed: 02/06/2018 Elsevier Patient Education  2020 [employer].

## 2019-05-05 ENCOUNTER — Telehealth: Payer: Self-pay

## 2019-05-05 NOTE — Telephone Encounter (Signed)
Patient came in last week and saw [place].  Patient would like Rx for Phexxi. They discussed birth control and patient said she mentioned this to [place] but did not think she could afford it but she has found a coupon. (Never sexually active.Getting married this week.)  (Phexxi is a non-hormonal contraceptive gel that requires a prescription. It lowers the vagina's pH to the more acidic range, making it difficult for sperm to survive and reach the egg.)

## 2019-05-08 ENCOUNTER — Other Ambulatory Visit: Payer: Self-pay

## 2019-05-08 MED ORDER — PHEXXI 1.8-1-0.4 % VA GEL: 5.0000 g | VAGINAL | 6 refills | Status: DC | PRN

## 2019-05-08 NOTE — Telephone Encounter (Signed)
Agree with Phexxi.

## 2019-05-08 NOTE — Telephone Encounter (Signed)
Rx sent. Patient informed. 

## 2019-07-09 ENCOUNTER — Ambulatory Visit: Payer: Self-pay | Attending: Internal Medicine

## 2019-07-09 DIAGNOSIS — Z20822 Contact with and (suspected) exposure to covid-19: Secondary | ICD-10-CM

## 2019-07-10 ENCOUNTER — Telehealth: Payer: Self-pay | Admitting: *Deleted

## 2019-07-10 LAB — SARS-COV-2, NAA 2 DAY TAT

## 2019-07-10 LAB — NOVEL CORONAVIRUS, NAA: SARS-CoV-2, NAA: NOT DETECTED

## 2019-07-10 NOTE — Telephone Encounter (Signed)
Pt states she had direct household exposure to sister who tested covid positive Sunday. Questioning quarantine precautions. She has tested negative, yesterday. Questions answered to pts satisfaction, verbalizes understanding.

## 2019-08-11 ENCOUNTER — Ambulatory Visit (HOSPITAL_COMMUNITY)
Admission: EM | Admit: 2019-08-11 | Discharge: 2019-08-11 | Disposition: A | Discharge status: Home / Self Care | Payer: BC Managed Care – PPO | Attending: Family Medicine | Admitting: Family Medicine

## 2019-08-11 ENCOUNTER — Encounter (HOSPITAL_COMMUNITY): Payer: Self-pay

## 2019-08-11 ENCOUNTER — Other Ambulatory Visit: Payer: Self-pay

## 2019-08-11 DIAGNOSIS — R062 Wheezing: Secondary | ICD-10-CM | POA: Diagnosis not present

## 2019-08-11 MED ORDER — ALBUTEROL SULFATE HFA 108 (90 BASE) MCG/ACT IN AERS: 1.0000 | INHALATION_SPRAY | Freq: Four times a day (QID) | RESPIRATORY_TRACT | 0 refills | Status: DC | PRN

## 2019-08-11 MED ORDER — MONTELUKAST SODIUM 10 MG PO TABS: 10.0000 mg | ORAL_TABLET | Freq: Every day | ORAL | 0 refills | Status: DC

## 2019-08-11 MED ORDER — PREDNISONE 20 MG PO TABS: 20.0000 mg | ORAL_TABLET | Freq: Two times a day (BID) | ORAL | 0 refills | Status: DC

## 2019-08-11 NOTE — Discharge Instructions (Addendum)
Singulair is an allergy pill that helps prevent exercise-induced asthma and wheezing Take once a day in the evening  take prednisone 2 times a day for 5 days Drink plenty of fluids I have given you an albuterol inhaler to try prior to exercise to see if it reduces the wheezing and shortness of breath Consider a trial of Zyrtec 1 pill a day every day if the above does not help Follow-up with your primary care doctor

## 2019-08-11 NOTE — ED Provider Notes (Signed)
[facility]    CSN: [id number] Arrival date & time: 08/11/19  1906      History   Chief Complaint Chief Complaint  Patient presents with  . Cough  . Shortness of Breath    HPI Gina Shepherd is a 24 y.o. female.   HPI  Patient's had more coughing and wheezing ever since she moved into a new apartment in March She states that it is very dusty She is never had allergies before She thinks she may have an indoor allergen She tried to play basketball tonight and had wheezing She has had some shortness of breath sensation with her bad coughing spells She has never had wheezing before She has never had asthma She has never been a cigarette smoker She has tried cleaning the house and dusting, she has tried an Product manager and these have not helped She has not had any cough cold runny nose or infection symptoms.  She did have Covid in October 2020  Past Medical History:  Diagnosis Date  . Acne 06/2016   completed accutane treatment in 02/2017.    Patient Active Problem List   Diagnosis Date Noted  . Dysmenorrhea 04/13/2015    Past Surgical History:  Procedure Laterality Date  . WISDOM TOOTH EXTRACTION      OB History    Gravida  0   Para  0   Term  0   Preterm  0   AB  0   Living  0     SAB  0   TAB  0   Ectopic  0   Multiple  0   Live Births               Home Medications    Prior to Admission medications   Medication Sig Start Date End Date Taking? Authorizing Provider  albuterol (VENTOLIN HFA) 108 (90 Base) MCG/ACT inhaler Inhale 1-2 puffs into the lungs every 6 (six) hours as needed for wheezing or shortness of breath. 08/11/19   Eustace Moore, MD  escitalopram (LEXAPRO) 10 MG tablet Take 1 tablet (10 mg total) by mouth daily. 04/22/18   Harrington Challenger, NP  ibuprofen (ADVIL) 600 MG tablet Take 1 tablet (600 mg total) by mouth every 8 (eight) hours as needed. 04/30/19   Harrington Challenger, NP  Lactic Ac-Citric Ac-Pot Bitart  (PHEXXI) 1.8-1-0.4 % GEL Place 5 g vaginally as needed. 05/08/19   Genia Del, MD  Lactobacillus (PROBIOTIC ACIDOPHILUS PO) Take by mouth.    [provider]  montelukast (SINGULAIR) 10 MG tablet Take 1 tablet (10 mg total) by mouth at bedtime. 08/11/19   Eustace Moore, MD  Multiple Vitamin (MULTI-VITAMIN DAILY PO) Take by mouth.    [provider]  NON FORMULARY ashwaganda 2 tab daily    [provider]  predniSONE (DELTASONE) 20 MG tablet Take 1 tablet (20 mg total) by mouth 2 (two) times daily with a meal. 08/11/19   Eustace Moore, MD    Family History Family History  Problem Relation Age of Onset  . High Cholesterol Maternal Grandmother   . Endometriosis Other     Social History Social History   Tobacco Use  . Smoking status: Former Games developer  . Smokeless tobacco: Never Used  Vaping Use  . Vaping Use: Never used  Substance Use Topics  . Alcohol use: Yes    Alcohol/week: 0.0 standard drinks    Comment: rare  . Drug use: No  Allergies   Epiduo [adapalene-benzoyl peroxide]   Review of Systems Review of Systems  Respiratory: Positive for cough, shortness of breath and wheezing.      Physical Exam Triage Vital Signs ED Triage Vitals  Enc Vitals Group     BP 08/11/19 2008 124/71     Pulse Rate 08/11/19 2008 64     Resp 08/11/19 2008 14     Temp 08/11/19 2008 97.9 F (36.6 C)     Temp Source 08/11/19 2008 Oral     SpO2 08/11/19 2008 100 %     Weight --      Height --      Head Circumference --      Peak Flow --      Pain Score 08/11/19 2007 0     Pain Loc --      Pain Edu? --      Excl. in [place]? --    No data found.  Updated Vital Signs BP 124/71 (BP Location: Right Arm)   Pulse 64   Temp 97.9 F (36.6 C) (Oral)   Resp 14   LMP 08/08/2019 (Exact Date)   SpO2 100%     Physical Exam Constitutional:      General: She is not in acute distress.    Appearance: She is well-developed.  HENT:     Head:  Normocephalic and atraumatic.     Mouth/Throat:     Mouth: Mucous membranes are moist.  Eyes:     Conjunctiva/sclera: Conjunctivae normal.     Pupils: Pupils are equal, round, and reactive to light.  Cardiovascular:     Rate and Rhythm: Normal rate and regular rhythm.     Heart sounds: Normal heart sounds.  Pulmonary:     Effort: Pulmonary effort is normal. No respiratory distress.     Breath sounds: No decreased breath sounds.     Comments: Lungs are clear throughout Abdominal:     General: There is no distension.     Palpations: Abdomen is soft.  Musculoskeletal:        General: Normal range of motion.     Cervical back: Normal range of motion.  Skin:    General: Skin is warm and dry.  Neurological:     Mental Status: She is alert.  Psychiatric:        Mood and Affect: Mood normal.        Behavior: Behavior normal.      UC Treatments / Results  Labs (all labs ordered are listed, but only abnormal results are displayed) Labs Reviewed - No data to display  EKG   Radiology No results found.  Procedures Procedures (including critical care time)  Medications Ordered in UC Medications - No data to display  Initial Impression / Assessment and Plan / UC Course  I have reviewed the triage vital signs and the nursing notes.  Pertinent labs & imaging results that were available during my care of the patient were reviewed by me and considered in my medical decision making (see chart for details).     Patient with previously no allergies, no asthma, no smoking or wheezing it is unusual for her to have exercise-induced asthma.  This may be a result of a viral bronchial infection or possibly an allergic reaction.  We will treat for allergies and have her follow-up with her primary care    Final Clinical Impressions(s) / UC Diagnoses   Final diagnoses:  Wheezing     Discharge Instructions  Singulair is an allergy pill that helps prevent exercise-induced asthma  and wheezing Take once a day in the evening  take prednisone 2 times a day for 5 days Drink plenty of fluids I have given you an albuterol inhaler to try prior to exercise to see if it reduces the wheezing and shortness of breath Consider a trial of Zyrtec 1 pill a day every day if the above does not help Follow-up with your primary care doctor    ED Prescriptions    Medication Sig Dispense Auth. Provider   montelukast (SINGULAIR) 10 MG tablet Take 1 tablet (10 mg total) by mouth at bedtime. 30 tablet Eustace Moore, MD   predniSONE (DELTASONE) 20 MG tablet Take 1 tablet (20 mg total) by mouth 2 (two) times daily with a meal. 10 tablet Eustace Moore, MD   albuterol (VENTOLIN HFA) 108 (90 Base) MCG/ACT inhaler Inhale 1-2 puffs into the lungs every 6 (six) hours as needed for wheezing or shortness of breath. 18 g Eustace Moore, MD     PDMP not reviewed this encounter.   Eustace Moore, MD 08/11/19 2027

## 2019-08-11 NOTE — ED Triage Notes (Addendum)
Reports cough and SOB, that is worse at night. Reports relief when laying on her abdomen. Began sleeping with a humidifier and dusting weekly. Started to feel better, but upon playing basketball this evening, she states "I was wheezing after five minutes". Reports scant amount of dark tan phlegm. Has not been taking antihistamines or allergy medication.   Pt reports she had covid in October 2020

## 2019-08-20 DIAGNOSIS — Z23 Encounter for immunization: Secondary | ICD-10-CM | POA: Diagnosis not present

## 2019-08-26 DIAGNOSIS — R0602 Shortness of breath: Secondary | ICD-10-CM | POA: Diagnosis not present

## 2019-08-27 ENCOUNTER — Ambulatory Visit
Admission: RE | Admit: 2019-08-27 | Discharge: 2019-08-27 | Disposition: A | Discharge status: Home / Self Care | Payer: BC Managed Care – PPO | Source: Ambulatory Visit | Attending: Physician Assistant | Admitting: Physician Assistant

## 2019-08-27 ENCOUNTER — Other Ambulatory Visit: Payer: Self-pay | Admitting: Physician Assistant

## 2019-08-27 DIAGNOSIS — R0602 Shortness of breath: Secondary | ICD-10-CM

## 2019-08-27 DIAGNOSIS — R062 Wheezing: Secondary | ICD-10-CM

## 2020-03-16 ENCOUNTER — Other Ambulatory Visit: Payer: Self-pay

## 2020-03-16 ENCOUNTER — Encounter: Payer: Self-pay | Admitting: Allergy

## 2020-03-16 ENCOUNTER — Ambulatory Visit: Payer: 59 | Admitting: Allergy

## 2020-03-16 VITALS — BP 118/70 | HR 82 | Temp 91.0°F | Resp 18 | Ht 66.14 in | Wt 142.4 lb

## 2020-03-16 DIAGNOSIS — R12 Heartburn: Secondary | ICD-10-CM

## 2020-03-16 DIAGNOSIS — J3089 Other allergic rhinitis: Secondary | ICD-10-CM | POA: Insufficient documentation

## 2020-03-16 DIAGNOSIS — J45909 Unspecified asthma, uncomplicated: Secondary | ICD-10-CM

## 2020-03-16 HISTORY — DX: Heartburn: R12

## 2020-03-16 HISTORY — DX: Other allergic rhinitis: J30.89

## 2020-03-16 MED ORDER — FLUTICASONE PROPIONATE 50 MCG/ACT NA SUSP: 1.0000 | Freq: Two times a day (BID) | NASAL | 5 refills | Status: DC | PRN

## 2020-03-16 NOTE — Patient Instructions (Addendum)
Today's skin testing showed: Positive to dust mites only. Negative to common foods.   Breathing:  Breathing test showed slight improvement after treatment. Daily controller medication(s): Start Alvesco 1 puff twice a day with spacer and rinse mouth afterwards for 1 month. Sample given and demonstrated proper use. If this inhaler works well for you, let me know and I will send in a prescription.  Spacer given and demonstrated proper use with inhaler. Patient understood technique and all questions/concerned were addressed.  May use albuterol rescue inhaler 2 puffs every 4 to 6 hours as needed for shortness of breath, chest tightness, coughing, and wheezing. May use albuterol rescue inhaler 2 puffs 5 to 15 minutes prior to strenuous physical activities. Monitor frequency of use.  Breathing control goals:  Full participation in all desired activities (may need albuterol before activity) Albuterol use two times or less a week on average (not counting use with activity) Cough interfering with sleep two times or less a month Oral steroids no more than once a year No hospitalizations  Environmental allergies  Start environmental control measures as below.  May use over the counter antihistamines such as Zyrtec (cetirizine), Claritin (loratadine), Allegra (fexofenadine), or Xyzal (levocetirizine) daily as needed.  Continue Singulair (montelukast) 10mg  daily at night.  May use Flonase (fluticasone) nasal spray 1 spray per nostril twice a day as needed for nasal congestion.   Heartburn:  See below for lifestyle modifications.  Follow up in 2 months or sooner if needed.   Control of House Dust Mite Allergen . Dust mite allergens are a common trigger of allergy and asthma symptoms. While they can be found throughout the house, these microscopic creatures thrive in warm, humid environments such as bedding, upholstered furniture and carpeting. . Because so much time is spent in the  bedroom, it is essential to reduce mite levels there.  . Encase pillows, mattresses, and box springs in special allergen-proof fabric covers or airtight, zippered plastic covers.  . Bedding should be washed weekly in hot water (130 F) and dried in a hot dryer. Allergen-proof covers are available for comforters and pillows that can't be regularly washed.  the allergy-proof covers every few months. Minimize clutter in the bedroom. Keep pets out of the bedroom.  Gina Shepherd Keep humidity less than 50% by using a dehumidifier or air conditioning. You can buy a humidity measuring device called a hygrometer to monitor this.  . If possible, replace carpets with hardwood, linoleum, or washable area rugs. If that's not possible, vacuum frequently with a vacuum that has a HEPA filter. . Remove all upholstered furniture and non-washable window drapes from the bedroom. . Remove all non-washable stuffed toys from the bedroom.  Wash stuffed toys weekly.  Heartburn Heartburn is a type of pain or discomfort that can happen in your throat or chest. It is often described as a burning pain. It may also cause a bad, acid-like taste in your mouth. It may be caused by stomach contents that move back up (reflux) into the part of the body that moves food from your mouth to your stomach (esophagus). Heartburn may feel worse:  When you lie down.  When you bend over.  At night. Follow these instructions at home: Eating and drinking  Avoid certain foods and drinks as told by your doctor. This may include: ? Coffee and tea, with or without caffeine. ? Drinks that have alcohol. ? Energy drinks and sports drinks. ? Carbonated drinks or sodas. ? Chocolate and cocoa. ?  Peppermint and mint flavorings. ? Garlic and onions. ? Horseradish. ? Spicy and acidic foods, such as:  Peppers.  Chili powder and curry powder.  Vinegar.  Hot sauces and BBQ sauce. ? Citrus fruit juices and citrus fruits, such  as:  Oranges.  Lemons.  Limes. ? Tomato-based foods, such as:  Red sauce and pizza with red sauce.  Chili.  Salsa. ? Fried and fatty foods, such as:  Donuts.  [place] fries and potato chips.  High-fat dressings. ? High-fat meats, such as:  Hot dogs and sausage.  Rib eye steak.  Ham and bacon. ? High-fat dairy items, such as:  Whole milk.  Butter.  Cream cheese.  Eat small meals often. Avoid eating large meals.  Avoid drinking large amounts of liquid with your meals.  Avoid eating meals during the 2-3 hours before bedtime.  Avoid lying down right after you eat.  Do not exercise right after you eat.   Lifestyle  If you are overweight, lose an amount of weight that is healthy for you. Ask your doctor about a safe weight loss goal.  Do not smoke or use any products that contain nicotine or tobacco. These can make your symptoms worse. If you need help quitting, ask your doctor.  Wear loose clothes. Do not wear anything tight around your waist.  Raise (elevate) the head of your bed about 6 inches (15 cm) when you sleep. You can use a wedge to do this.  Try to lower your stress. If you need help doing this, ask your doctor.      Medicines  Take over-the-counter and prescription medicines only as told by your doctor.  Do not take aspirin or NSAIDs, such as ibuprofen, unless your doctor says it is okay.  Stop medicines only as told by your doctor. If you stop taking some medicines too quickly, your symptoms may get worse. General instructions  Watch for any changes in your symptoms.  Keep all follow-up visits. Contact a doctor if:  You have new symptoms.  You lose weight and you do not know why.  You have trouble swallowing, or it hurts to swallow.  You have wheezing or a cough that keeps happening.  Your symptoms do not get better with treatment.  You have heartburn often for more than 2 weeks. Get help right away if:  You have pain in your  arms, neck, jaw, teeth, or back all of a sudden.  You feel sweaty, dizzy, or light-headed all of a sudden.  You have chest pain or shortness of breath.  You vomit and your vomit looks like blood or coffee grounds.  Your poop (stool) is bloody or black. These symptoms may be an emergency. Get help right away. Call your local emergency services (911 in the [place]  Do not wait to see if the symptoms will go away.  Do not drive yourself to the hospital. Summary  Heartburn is a type of pain that can happen in your throat or chest. It can feel like a burning pain. It may also cause a bad, acid-like taste in your mouth.  You may need to avoid certain foods and drinks to help your symptoms. Ask your doctor what foods and drinks you should avoid.  Take over-the-counter and prescription medicines only as told by your doctor. Do not take aspirin or NSAIDs, such as ibuprofen, unless your doctor told you to do so.  Contact your doctor if your symptoms do not get better or they get worse. This  information is not intended to replace advice given to you by your health care provider. Make sure you discuss any questions you have with your health care provider. Document Revised: 08/20/2019 Document Reviewed: 08/20/2019 Elsevier Patient Education  2021 [employer].

## 2020-03-16 NOTE — Assessment & Plan Note (Signed)
Rhinitis symptoms for less than 1 year with worsening in the spring and fall.  Tried Zyrtec with good benefit.  No previous allergy/ENT evaluation.  Today's skin testing was positive to dust mites only. Negative to common foods.  Start environmental control measures as below.  May use over the counter antihistamines such as Zyrtec (cetirizine), Claritin (loratadine), Allegra (fexofenadine), or Xyzal (levocetirizine) daily as needed.  Continue Singulair (montelukast) 10mg  daily at night.  May use Flonase (fluticasone) nasal spray 1 spray per nostril twice a day as needed for nasal congestion.

## 2020-03-16 NOTE — Assessment & Plan Note (Signed)
Some heartburn symptoms and takes OTC medications prn with good benefit.  See below for lifestyle modifications.  Uncontrolled reflux/heartburn can also worsen respiratory symptoms. If no improvement with above regimen, will do a trial of PPI next.

## 2020-03-16 NOTE — Progress Notes (Signed)
New Patient Note  RE: Gina Shepherd MRN: [id number] DOB: 04/23/1995 Date of Office Visit: 03/16/2020  Referring provider: Milus Heightedmon, Noelle, PA Primary care provider: Milus Heightedmon, Noelle, PA  Chief Complaint: Breathing Problem  History of Present Illness: I had the pleasure of seeing Gina JourneyMaddison Shepherd for initial evaluation at the [facility] on 03/16/2020. She is a 25 y.o. female, who is self-referred here for the evaluation of asthma and allergies.   Patient had COVID-19 in October 2020. But in March 2021, patient noted breathing issues daily for a few months. She was yawning a lot and had to take deep breaths which was worse at night.  She was playing at a basketball game in the summer 2021 and started wheezing. [facility] and [facility] told she had an asthmatic reaction. No prior history of asthma/inhaler use.   Now still having symptoms on and off but mainly at night.   She reports symptoms of chest tightness, shortness of breath, coughing, wheezing for <1 years. Current medications include albuterol prn and Singulair daily which help. She reports not using aerochamber with inhalers. She tried the following inhalers: none. Main triggers are exertion and unknown. In the last month, frequency of symptoms: depends. Frequency of nocturnal symptoms: 0x/month. Frequency of SABA use: <0x/week. Interference with physical activity: sometimes. Sleep is undisturbed. In the last 12 months, emergency room visits/urgent care visits/doctor office visits or hospitalizations due to respiratory issues: 2. In the last 12 months, oral steroids courses: one course with some benefit. Lifetime history of hospitalization for respiratory issues: no. Prior intubations: no. History of pneumonia: as a child. She was not evaluated by allergist/pulmonologist in the past. Smoking exposure: no. Up to date with flu vaccine: no. Up to date with COVID-19 vaccine: no.  History of reflux: takes OTC medications as  needed.  CXR 08/27/2019: IMPRESSION: Mild central bronchial thickening which can be seen with asthma.  She reports symptoms of rhinorrhea, nasal congestion. Symptoms have been going on for <1 years. The symptoms are present all year around with worsening in spring and fall. Other triggers include exposure to unknown. Anosmia: no. Headache: sometimes. She has used zyrtec with fair improvement in symptoms. Sinus infections: no. Previous work up includes: none. Previous ENT evaluation: no. Previous sinus imaging: no. History of nasal polyps: no.  08/11/2019 [facility] visit: "HPI  Patient's had more coughing and wheezing ever since she moved into a new apartment in March She states that it is very dusty She is never had allergies before She thinks she may have an indoor allergen She tried to play basketball tonight and had wheezing She has had some shortness of breath sensation with her bad coughing spells She has never had wheezing before She has never had asthma She has never been a cigarette smoker She has tried cleaning the house and dusting, she has tried an Product managerair purifier and these have not helped She has not had any cough cold runny nose or infection symptoms.  She did have Covid in October 2020"  Assessment and Plan: Gina Shepherd is a 25 y.o. female with: Reactive airway disease without complication COVID-19 infection in October 2020 and started having issues with her breathing in March 2021 with chest tightness, shortness of breath, coughing, wheezing, and yawning especially at night.  1 episode of wheezing after playing a basketball game in June 2021. CXR at that time concerning for asthma. No prior diagnosis of asthma. Uses albuterol as needed and Singulair daily with some benefit.  1 course of prednisone.   Today's spirometry was normal with 6% improvement in FEV1 post bronchodilator treatment.   Some concern for reactive airway disease post COVID-19 infection.  Daily controller  medication(s): Start Alvesco 1 puff twice a day with spacer and rinse mouth afterwards for 1 month. Sample given and demonstrated proper use. If this inhaler works well for you, let me know and I will send in a prescription.  Spacer given and demonstrated proper use with inhaler. Patient understood technique and all questions/concerned were addressed.   Continue Singulair (montelukast) 10mg  daily at night. May use albuterol rescue inhaler 2 puffs every 4 to 6 hours as needed for shortness of breath, chest tightness, coughing, and wheezing. May use albuterol rescue inhaler 2 puffs 5 to 15 minutes prior to strenuous physical activities. Monitor frequency of use.  Repeat spirometry at next visit.   Other allergic rhinitis Rhinitis symptoms for less than 1 year with worsening in the spring and fall.  Tried Zyrtec with good benefit.  No previous allergy/ENT evaluation.  Today's skin testing was positive to dust mites only. Negative to common foods.  Start environmental control measures as below.  May use over the counter antihistamines such as Zyrtec (cetirizine), Claritin (loratadine), Allegra (fexofenadine), or Xyzal (levocetirizine) daily as needed.  Continue Singulair (montelukast) 10mg  daily at night.  May use Flonase (fluticasone) nasal spray 1 spray per nostril twice a day as needed for nasal congestion.   Heartburn Some heartburn symptoms and takes OTC medications prn with good benefit.  See below for lifestyle modifications.  Uncontrolled reflux/heartburn can also worsen respiratory symptoms. If no improvement with above regimen, will do a trial of PPI next.   Return in about 2 months (around 05/14/2020).  Meds ordered this encounter  Medications  . fluticasone (FLONASE) 50 MCG/ACT nasal spray    Sig: Place 1 spray into both nostrils 2 (two) times daily as needed.    Dispense:  16 g    Refill:  5   Other allergy screening: Food allergy: no Medication allergy: yes   epiduo topical - swelling Hymenoptera allergy: no Urticaria: no Eczema:no History of recurrent infections suggestive of immunodeficency: no  Diagnostics: Spirometry:  Tracings reviewed. Her effort: Good reproducible efforts. FVC: 3.15L FEV1: 2.70L, 83% predicted FEV1/FVC ratio: 86% Interpretation: Spirometry consistent with normal pattern with 6% improvement in FEV1 post bronchodilator treatment.  Please see scanned spirometry results for details.  Skin Testing: Environmental allergy panel and select foods.  Positive test to: dust mites.  Results discussed with patient/family.  Pediatric Percutaneous Testing - 03/16/20 1523    Location Back          Airborne Adult Perc - 03/16/20 1451    Time Antigen Placed 1451    Allergen Manufacturer 03/18/20    Location Back    Number of Test 59    1. Control-Buffer 50% Glycerol Negative    2. Control-Histamine 1 mg/ml 2+    3. Albumin saline Negative    4. Bahia Negative    5. 03/18/20 Negative    6. Johnson Negative    7. Kentucky Blue Negative    8. Meadow Fescue Negative    9. Perennial Rye Negative    10. Sweet Vernal Negative    11. Timothy Negative    12. Cocklebur Negative    13. Burweed Marshelder Negative    14. Ragweed, short Negative    15. Ragweed, Giant Negative    16. Plantain,  English Negative  17. Lamb's Quarters Negative    18. Sheep Sorrell Negative    19. Rough Pigweed Negative    20. Marsh Elder, Rough Negative    21. Mugwort, Common Negative    22. Ash mix Negative    23. Birch mix Negative    24. Beech American Negative    25. Box, Elder Negative    26. Cedar, red Negative    27. Cottonwood, Guinea-Bissau Negative    28. Elm mix Negative    29. Hickory Negative    30. Maple mix Negative    31. Oak, Guinea-Bissau mix Negative    32. Pecan Pollen Negative    33. Pine mix Negative    34. Sycamore Eastern Negative    35. Walnut, Black Pollen Negative    36. Alternaria alternata Negative    37. Cladosporium  Herbarum Negative    38. Aspergillus mix Negative    39. Penicillium mix Negative    40. Bipolaris sorokiniana (Helminthosporium) Negative    41. Drechslera spicifera (Curvularia) Negative    42. Mucor plumbeus Negative    43. Fusarium moniliforme Negative    44. Aureobasidium pullulans (pullulara) Negative    45. Rhizopus oryzae Negative    46. Botrytis cinera Negative    47. Epicoccum nigrum Negative    48. Phoma betae Negative    49. Candida Albicans Negative    50. Trichophyton mentagrophytes Negative    51. Mite, D Farinae  5,000 AU/ml Negative    52. Mite, D Pteronyssinus  5,000 AU/ml Negative    53. Cat Hair 10,000 BAU/ml Negative    54.  Dog Epithelia Negative    55. Mixed Feathers Negative    56. Horse Epithelia Negative    57. Cockroach, German Negative    58. Mouse Negative    59. Tobacco Leaf Negative            Food Adult Perc - 03/16/20 1400    Time Antigen Placed 1452    Allergen Manufacturer Waynette Buttery    Location Back    Number of allergen test 10    1. Peanut Negative    2. Soybean Negative    3. Wheat Negative    4. Sesame Negative    5. Milk, cow Negative    6. Egg White, Chicken Negative    7. Casein Negative    8. Shellfish Mix Negative    9. Fish Mix Negative    10. Cashew Negative          Intradermal     Control 2+  French Southern Territories Negative  Johnson Negative  7 Grass Negative  Ragweed mix Negative  Weed mix Negative  Tree mix Negative  Mold 1 Negative  Mold 2 Negative  Mold 3 Negative  Mold 4 Negative  Cat Negative  Dog Negative  Cockroach Negative  Mite mix 2+     Past Medical History: Patient Active Problem List   Diagnosis Date Noted  . Reactive airway disease without complication 03/16/2020  . Other allergic rhinitis 03/16/2020  . Heartburn 03/16/2020  . Dysmenorrhea 04/13/2015   Past Medical History:  Diagnosis Date  . Acne 06/2016   completed accutane treatment in 02/2017.   Past Surgical History: Past Surgical History:   Procedure Laterality Date  . WISDOM TOOTH EXTRACTION     Medication List:  Current Outpatient Medications  Medication Sig Dispense Refill  . albuterol (VENTOLIN HFA) 108 (90 Base) MCG/ACT inhaler Inhale 1-2 puffs into the lungs every 6 (six) hours as  needed for wheezing or shortness of breath. 18 g 0  . escitalopram (LEXAPRO) 10 MG tablet Take 1 tablet (10 mg total) by mouth daily. 90 tablet 4  . fluticasone (FLONASE) 50 MCG/ACT nasal spray Place 1 spray into both nostrils 2 (two) times daily as needed. 16 g 5  . ibuprofen (ADVIL) 600 MG tablet Take 1 tablet (600 mg total) by mouth every 8 (eight) hours as needed. 60 tablet 1  . Lactobacillus (PROBIOTIC ACIDOPHILUS PO) Take by mouth.    . montelukast (SINGULAIR) 10 MG tablet Take 1 tablet (10 mg total) by mouth at bedtime. 30 tablet 0  . Multiple Vitamin (MULTI-VITAMIN DAILY PO) Take by mouth.     No current facility-administered medications for this visit.   Allergies: Allergies  Allergen Reactions  . Epiduo [Adapalene-Benzoyl Peroxide] Swelling   Social History: Social History   Socioeconomic History  . Marital status: Married    Spouse name: Not on file  . Number of children: Not on file  . Years of education: Not on file  . Highest education level: Not on file  Occupational History  . Not on file  Tobacco Use  . Smoking status: Former Games developermoker  . Smokeless tobacco: Never Used  Vaping Use  . Vaping Use: Never used  Substance and Sexual Activity  . Alcohol use: Yes    Alcohol/week: 0.0 standard drinks    Comment: rare  . Drug use: No  . Sexual activity: Yes    Birth control/protection: Condom  Other Topics Concern  . Not on file  Social History Narrative  . Not on file   Social Determinants of Health   Financial Resource Strain: Not on file  Food Insecurity: Not on file  Transportation Needs: Not on file  Physical Activity: Not on file  Stress: Not on file  Social Connections: Not on file   Lives in an  apartment. Smoking: denies Occupation: wine Writersales consultant  Environmental HistorySurveyor, minerals: Water Damage/mildew in the house: no Engineer, civil (consulting)Carpet in the family room: no Carpet in the bedroom: yes Heating: electric Cooling: central Pet: yes 1 cat x 4 yrs  Family History: Family History  Problem Relation Age of Onset  . High Cholesterol Maternal Grandmother   . Endometriosis Other    Problem                               Relation Asthma                                   No  Eczema                                No  Food allergy                          No  Allergic rhino conjunctivitis     Sister   Review of Systems  Constitutional: Negative for appetite change, chills, fever and unexpected weight change.  HENT: Positive for congestion and rhinorrhea.   Eyes: Negative for itching.  Respiratory: Positive for cough, chest tightness, shortness of breath and wheezing.   Cardiovascular: Negative for chest pain.  Genitourinary: Negative for difficulty urinating.  Skin: Negative for rash.  Allergic/Immunologic: Positive for environmental allergies. Negative for food allergies.  Neurological:  Negative for headaches.   Objective: BP 118/70 (BP Location: Left Arm, Patient Position: Sitting, Cuff Size: Normal)   Pulse 82   Temp (!) 91 F (32.8 C) (Temporal)   Resp 18   Ht 5' 6.14" (1.68 m)   Wt 142 lb 6.4 oz (64.6 kg)   SpO2 97%   BMI 22.89 kg/m  Body mass index is 22.89 kg/m. Physical Exam Vitals and nursing note reviewed.  Constitutional:      Appearance: Normal appearance. She is well-developed.  HENT:     Head: Normocephalic and atraumatic.     Right Ear: Tympanic membrane and external ear normal.     Left Ear: Tympanic membrane and external ear normal.     Nose: Congestion (on right side) present.     Mouth/Throat:     Mouth: Mucous membranes are moist.     Pharynx: Oropharynx is clear.  Eyes:     Conjunctiva/sclera: Conjunctivae normal.  Cardiovascular:     Rate and Rhythm:  Normal rate and regular rhythm.     Heart sounds: Normal heart sounds. No murmur heard. No friction rub. No gallop.   Pulmonary:     Effort: Pulmonary effort is normal.     Breath sounds: Normal breath sounds. No wheezing, rhonchi or rales.  Musculoskeletal:     Cervical back: Neck supple.  Skin:    General: Skin is warm.     Findings: No rash.  Neurological:     Mental Status: She is alert and oriented to person, place, and time.  Psychiatric:        Behavior: Behavior normal.    The plan was reviewed with the patient/family, and all questions/concerned were addressed.  It was my pleasure to see Gina Shepherd today and participate in her care. Please feel free to contact me with any questions or concerns.  Sincerely,  Wyline Mood, DO Allergy & Immunology  [facility] office: [phone] [facility] [place] office: [phone]

## 2020-03-16 NOTE — Assessment & Plan Note (Addendum)
COVID-19 infection in October 2020 and started having issues with her breathing in March 2021 with chest tightness, shortness of breath, coughing, wheezing, and yawning especially at night.  1 episode of wheezing after playing a basketball game in June 2021. CXR at that time concerning for asthma. No prior diagnosis of asthma. Uses albuterol as needed and Singulair daily with some benefit. 1 course of prednisone.   Today's spirometry was normal with 6% improvement in FEV1 post bronchodilator treatment.   Some concern for reactive airway disease post COVID-19 infection.  Daily controller medication(s): Start Alvesco 1 puff twice a day with spacer and rinse mouth afterwards for 1 month. Sample given and demonstrated proper use. If this inhaler works well for you, let me know and I will send in a prescription.  Spacer given and demonstrated proper use with inhaler. Patient understood technique and all questions/concerned were addressed.   Continue Singulair (montelukast) 10mg  daily at night. May use albuterol rescue inhaler 2 puffs every 4 to 6 hours as needed for shortness of breath, chest tightness, coughing, and wheezing. May use albuterol rescue inhaler 2 puffs 5 to 15 minutes prior to strenuous physical activities. Monitor frequency of use.  Repeat spirometry at next visit.

## 2020-03-17 ENCOUNTER — Ambulatory Visit: Payer: 59 | Admitting: Nurse Practitioner

## 2020-03-17 ENCOUNTER — Other Ambulatory Visit: Payer: Self-pay | Admitting: Nurse Practitioner

## 2020-03-17 ENCOUNTER — Telehealth: Payer: Self-pay | Admitting: *Deleted

## 2020-03-17 ENCOUNTER — Encounter: Payer: Self-pay | Admitting: Nurse Practitioner

## 2020-03-17 VITALS — BP 102/62 | Temp 98.6°F | Wt 138.8 lb

## 2020-03-17 DIAGNOSIS — N6324 Unspecified lump in the left breast, lower inner quadrant: Secondary | ICD-10-CM

## 2020-03-17 DIAGNOSIS — Z3009 Encounter for other general counseling and advice on contraception: Secondary | ICD-10-CM | POA: Diagnosis not present

## 2020-03-17 NOTE — Progress Notes (Signed)
   Acute Office Visit  Subjective:    Patient ID: Gina Shepherd, female    DOB: Jan 01, 1996, 25 y.o.   MRN: [id number]   HPI 25 y.o. presents today for lump in left breast that she first noticed 4 days ago. Slight tenderness today but she thinks this is from her pushing on it the last few days. Denies redness, swelling, fever, or nipple discharge. She also is considering contraception and would like to discuss options. She has tried Nuvaring and COCs but had fear of blood clots and felt her anxiety was worse while taking. She is thinking about Paraguard but has heavy, painful cycles some months. Married since march 2021.  No family history of breast cancer.  Mother does have calcifications that are being followed every 6 months.   Review of Systems  Constitutional: Negative.   Genitourinary: Negative.   Breast: Left breast lump, mobile, slight tenderness with palpation, no discharge     Objective:    Physical Exam Constitutional:      Appearance: Normal appearance.  Chest:  Breasts:     Right: Normal.     Left: Mass and tenderness present. No swelling, inverted nipple, nipple discharge, skin change, axillary adenopathy or supraclavicular adenopathy.     Lymphadenopathy:     Upper Body:     Left upper body: No supraclavicular or axillary adenopathy.     BP 102/62 (BP Location: Right Arm, Patient Position: Sitting)   Temp 98.6 F (37 C)   Wt 138 lb 12.8 oz (63 kg)   LMP 03/07/2020 (Exact Date)   BMI 22.31 kg/m  Wt Readings from Last 3 Encounters:  03/17/20 138 lb 12.8 oz (63 kg)  03/16/20 142 lb 6.4 oz (64.6 kg)  04/30/19 129 lb (58.5 kg)        Assessment & Plan:   Problem List Items Addressed This Visit   None   Visit Diagnoses    Breast lump on left side at 7 o'clock position    -  Primary   Encounter for other general counseling and advice on contraception          Plan: Reassurance provided that lump is likely cystic in nature.  We will send referral  for left breast ultrasound.  Tylenol/Ibuprofen as needed for pain. Contraceptive options were reviewed, including hormonal methods, both combination (pill, patch, vaginal ring) and progesterone-only (pill, Depo Provera and Nexplanon), intrauterine devices (Mirena, [place], [place], and [place]), barrier methods (condoms, diaphragm) and female/female sterilization. The mechanisms, risks, benefits and side effects of all methods were discussed. She is aware that Paraguard may cause increase in dysmenorrhea and heavy bleeding. She is interested in Deshler IUD and wants to think about it.  She will either call to schedule insertion with Dr. Seymour Bars or we will discuss again at her annual visit in March.  She is agreeable to plan.     Olivia Mackie [facility], 10:02 AM 03/17/2020

## 2020-03-17 NOTE — Telephone Encounter (Signed)
-----   Message from Olivia Mackie, NP sent at 03/17/2020 10:09 AM EST ----- Please send referral for left breast ultrasound for mass at 7 o'clock. Thank you.

## 2020-03-17 NOTE — Telephone Encounter (Signed)
Patient scheduled on 03/19/20 @ 10:30am at the breast center.

## 2020-03-19 ENCOUNTER — Other Ambulatory Visit: Payer: BC Managed Care – PPO

## 2020-03-19 NOTE — Telephone Encounter (Signed)
Patient did not read my chart message and missed appointment today. I called and told her about this and apologized I meant to call her as well. Patient was okay, I provided her with the number to the breast center to call and reschedule.

## 2020-03-23 NOTE — Telephone Encounter (Signed)
Patient now scheduled on 04/30/20

## 2020-03-25 ENCOUNTER — Other Ambulatory Visit: Payer: 59 | Admitting: Nurse Practitioner

## 2020-03-25 ENCOUNTER — Other Ambulatory Visit: Payer: 59

## 2020-04-21 ENCOUNTER — Other Ambulatory Visit: Payer: Self-pay

## 2020-04-21 ENCOUNTER — Ambulatory Visit
Admission: RE | Admit: 2020-04-21 | Discharge: 2020-04-21 | Disposition: A | Discharge status: Home / Self Care | Payer: 59 | Source: Ambulatory Visit | Attending: Nurse Practitioner | Admitting: Nurse Practitioner

## 2020-04-21 ENCOUNTER — Other Ambulatory Visit: Payer: Self-pay | Admitting: Nurse Practitioner

## 2020-04-21 DIAGNOSIS — N6324 Unspecified lump in the left breast, lower inner quadrant: Secondary | ICD-10-CM

## 2020-04-30 ENCOUNTER — Other Ambulatory Visit: Payer: Self-pay

## 2020-05-05 ENCOUNTER — Encounter: Payer: Self-pay | Admitting: Nurse Practitioner

## 2020-05-05 ENCOUNTER — Other Ambulatory Visit: Payer: Self-pay

## 2020-05-05 ENCOUNTER — Ambulatory Visit: Payer: 59 | Admitting: Nurse Practitioner

## 2020-05-05 VITALS — BP 120/70 | Ht 64.0 in | Wt 136.0 lb

## 2020-05-05 DIAGNOSIS — Z01419 Encounter for gynecological examination (general) (routine) without abnormal findings: Secondary | ICD-10-CM | POA: Diagnosis not present

## 2020-05-05 DIAGNOSIS — N6325 Unspecified lump in the left breast, overlapping quadrants: Secondary | ICD-10-CM | POA: Diagnosis not present

## 2020-05-05 DIAGNOSIS — F418 Other specified anxiety disorders: Secondary | ICD-10-CM | POA: Diagnosis not present

## 2020-05-05 MED ORDER — ESCITALOPRAM OXALATE 10 MG PO TABS: 10.0000 mg | ORAL_TABLET | Freq: Every day | ORAL | 3 refills | Status: DC

## 2020-05-05 NOTE — Patient Instructions (Addendum)
Health Maintenance, Female Adopting a healthy lifestyle and getting preventive care are important in promoting health and wellness. Ask your health care provider about:  The right schedule for you to have regular tests and exams.  Things you can do on your own to prevent diseases and keep yourself healthy. What should I know about diet, weight, and exercise? Eat a healthy diet  Eat a diet that includes plenty of vegetables, fruits, low-fat dairy products, and lean protein.  Do not eat a lot of foods that are high in solid fats, added sugars, or sodium.   Maintain a healthy weight Body mass index (BMI) is used to identify weight problems. It estimates body fat based on height and weight. Your health care provider can help determine your BMI and help you achieve or maintain a healthy weight. Get regular exercise Get regular exercise. This is one of the most important things you can do for your health. Most adults should:  Exercise for at least 150 minutes each week. The exercise should increase your heart rate and make you sweat (moderate-intensity exercise).  Do strengthening exercises at least twice a week. This is in addition to the moderate-intensity exercise.  Spend less time sitting. Even light physical activity can be beneficial. Watch cholesterol and blood lipids Have your blood tested for lipids and cholesterol at 27 years of age, then have this test every 5 years. Have your cholesterol levels checked more often if:  Your lipid or cholesterol levels are high.  You are older than 27 years of age.  You are at high risk for heart disease. What should I know about cancer screening? Depending on your health history and family history, you may need to have cancer screening at various ages. This may include screening for:  Breast cancer.  Cervical cancer.  Colorectal cancer.  Skin cancer.  Lung cancer. What should I know about heart disease, diabetes, and high blood  pressure? Blood pressure and heart disease  High blood pressure causes heart disease and increases the risk of stroke. This is more likely to develop in people who have high blood pressure readings, are of African descent, or are overweight.  Have your blood pressure checked: ? Every 3-5 years if you are 27-39 years of age. ? Every year if you are 27 years old or older. Diabetes Have regular diabetes screenings. This checks your fasting blood sugar level. Have the screening done:  Once every three years after age 27 if you are at a normal weight and have a low risk for diabetes.  More often and at a younger age if you are overweight or have a high risk for diabetes. What should I know about preventing infection? Hepatitis B If you have a higher risk for hepatitis B, you should be screened for this virus. Talk with your health care provider to find out if you are at risk for hepatitis B infection. Hepatitis C Testing is recommended for:  Everyone born from 1945 through 1965.  Anyone with known risk factors for hepatitis C. Sexually transmitted infections (STIs)  Get screened for STIs, including gonorrhea and chlamydia, if: ? You are sexually active and are younger than 27 years of age. ? You are older than 27 years of age and your health care provider tells you that you are at risk for this type of infection. ? Your sexual activity has changed since you were last screened, and you are at increased risk for chlamydia or gonorrhea. Ask your health care provider   if you are at risk.  Ask your health care provider about whether you are at high risk for HIV. Your health care provider may recommend a prescription medicine to help prevent HIV infection. If you choose to take medicine to prevent HIV, you should first get tested for HIV. You should then be tested every 3 months for as long as you are taking the medicine. Pregnancy  If you are about to stop having your period (premenopausal) and  you may become pregnant, seek counseling before you get pregnant.  Take 400 to 800 micrograms (mcg) of folic acid every day if you become pregnant.  Ask for birth control (contraception) if you want to prevent pregnancy. Osteoporosis and menopause Osteoporosis is a disease in which the bones lose minerals and strength with aging. This can result in bone fractures. If you are 54 years old or older, or if you are at risk for osteoporosis and fractures, ask your health care provider if you should:  Be screened for bone loss.  Take a calcium or vitamin D supplement to lower your risk of fractures.  Be given hormone replacement therapy (HRT) to treat symptoms of menopause. Follow these instructions at home: Lifestyle  Do not use any products that contain nicotine or tobacco, such as cigarettes, e-cigarettes, and chewing tobacco. If you need help quitting, ask your health care provider.  Do not use street drugs.  Do not share needles.  Ask your health care provider for help if you need support or information about quitting drugs. Alcohol use  Do not drink alcohol if: ? Your health care provider tells you not to drink. ? You are pregnant, may be pregnant, or are planning to become pregnant.  If you drink alcohol: ? Limit how much you use to 0-1 drink a day. ? Limit intake if you are breastfeeding.  Be aware of how much alcohol is in your drink. In the [place] one drink equals one 12 oz bottle of beer (355 mL), one 5 oz glass of wine (148 mL), or one 1 oz glass of hard liquor (44 mL). General instructions  Schedule regular health, dental, and eye exams.  Stay current with your vaccines.  Tell your health care provider if: ? You often feel depressed. ? You have ever been abused or do not feel safe at home. Summary  Adopting a healthy lifestyle and getting preventive care are important in promoting health and wellness.  Follow your health care provider's instructions about healthy  diet, exercising, and getting tested or screened for diseases.  Follow your health care provider's instructions on monitoring your cholesterol and blood pressure. This information is not intended to replace advice given to you by your health care provider. Make sure you discuss any questions you have with your health care provider. Document Revised: 02/06/2018 Document Reviewed: 02/06/2018 Elsevier Patient Education  2021 Elsevier Inc.  Fibroadenoma A fibroadenoma is a breast tumor that is not cancerous (is benign). These tumors may feel like lumps that move around slightly when touched. They are made up of breast tissue and tissue that holds breast tissue together (connective tissue). There are several types of fibroadenoma:  Simple fibroadenoma. This is the most common type. It contains only one type of tissue.  Complex fibroadenoma. This type contains more than one kind of tissue or irregular tissue, such as pockets of fluid (cysts) or deposits of calcium (calcifications) in the breast.  Juvenile fibroadenoma. This is a type of tumor that can develop in adolescent girls.  It tends to grow larger over time than other kinds of benign tumors. Having a complex or juvenile fibroadenoma may slightly increase the risk for developing breast cancer in the future. What are the causes? The cause of fibroadenoma is not known. What increases the risk? This condition is more likely to develop in:  Women who are between the ages of 70 and 28.  Women of African American descent. What are the signs or symptoms? You might have no symptoms. Some fibroadenomas are too small to feel. If you can feel it, it may feel like a lump in your breast that is:  Firm.  Round.  Smooth.  Slightly movable. A fibroadenoma usually occurs as a single lump, but sometimes there may be more than one lump. They can occur in one breast or in both breasts. Fibroadenomas vary in size. They usually do not cause pain unless  they grow large. How is this diagnosed? This condition may be diagnosed based on:  Your symptoms and medical history.  A physical exam. You may notice a lump during a breast self-exam, or your health care provider may notice it during a routine breast exam or breast X-ray (mammogram).  An ultrasound to check for fluid inside the lump (cystic tumor). If there is fluid, some fluid may be removed with a needle and examined under a microscope.  A mammogram to examine a lump that does not contain fluid (solid). Depending on mammogram results, you may need to have a procedure to remove a tissue sample from the lump using a needle (breast biopsy). The tissue will be examined under a microscope. How is this treated? Treatment for this condition may include:  Having clinical breast exams regularly to check for changes in your fibroadenoma.  Having the fibroadenoma removed. A fibroadenoma may be removed if it is: ? Large. ? Continuing to grow. ? Causing symptoms, such as pain or a change in the appearance of your breast. ? A juvenile fibroadenoma. These tend to grow large over time. Follow these instructions at home:  Do breast self-exams at home as told by your health care provider. Monitor your fibroadenoma. Check the skin of your breasts and your nipples for any changes.  Do not use any products that contain nicotine or tobacco. These products include cigarettes, chewing tobacco, and vaping devices, such as e-cigarettes. These can further increase your cancer risk. If you need help quitting, ask your health care provider.  Keep all follow-up visits. This is important. You will need breast exams on a regular basis.   Contact a health care provider if:  Your fibroadenoma: ? Gets larger. ? Feels different. ? Becomes painful.  You find a new breast lump.  You have any changes in your breast skin, such as: ? Dimpling. ? Bruising. ? Thickening. ? Redness.  You have any changes in your  nipple, such as: ? Fluid leaking from a nipple. ? Redness. Summary  Fibroadenoma is a breast tumor that is not cancerous (is benign). It is made up of breast tissue and tissue that holds breast tissue together (connective tissue).  Having a complex or juvenile fibroadenoma may slightly increase your risk for developing breast cancer in the future.  A fibroadenoma may feel like a lump in your breast. It is usually firm, round, smooth, and slightly movable. Some fibroadenomas are too small to be felt.  Do breast self-exams at home as told by your health care provider. Monitor your fibroadenoma, the skin of your breasts, and your nipples  for any changes. This information is not intended to replace advice given to you by your health care provider. Make sure you discuss any questions you have with your health care provider. Document Revised: 12/15/2019 Document Reviewed: 12/15/2019 Elsevier Patient Education  2021 [employer].

## 2020-05-05 NOTE — Progress Notes (Signed)
   Gina Shepherd Nov 11, 1995 [id number]   History:  25 y.o. G0 presents for annual exam without GYN complaints. Monthly cycle, using condoms consistently but considering IUD. Normal pap history. Has received Gardasil series. 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 Anxiety and depression managed well with Lexapro.  Gynecologic History Patient's last menstrual period was 04/28/2020.   Contraception/Family planning: condoms  Health Maintenance Last Pap (cytology only): 03/2018. Results were: normal Last mammogram/ultrasound: 04/21/2020. 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  ROS:  A ROS was performed and pertinent positives and negatives are included.  Exam:  Vitals:   05/05/20 0913  BP: 120/70  Weight: 136 lb (61.7 kg)  Height: 5\' 4"  (1.626 m)   Body mass index is 23.34 kg/m.  General appearance:  Normal Thyroid:  Symmetrical, normal in size, without palpable masses or nodularity. Respiratory  Auscultation:  Clear without wheezing or rhonchi Cardiovascular  Auscultation:  Regular rate, without rubs, murmurs or gallops  Edema/varicosities:  Not grossly evident Abdominal  Soft,nontender, without masses, guarding or rebound.  Liver/spleen:  No organomegaly noted  Hernia:  None appreciated  Skin  Inspection:  Grossly normal   Breasts: Examined lying and sitting.   Right: Without masses, retractions, discharge or axillary adenopathy.   Left: Without masses, retractions, discharge or axillary adenopathy. Gentitourinary   Inguinal/mons:  Normal without inguinal adenopathy  External genitalia:  Normal  BUS/Urethra/Skene's glands:  Normal  Vagina:  Normal  Cervix:   Normal  Uterus:  Normal in size, shape and contour.  Midline and mobile  Adnexa/parametria:     Rt: Without masses or tenderness.   Lt: Without masses or tenderness.  Anus and perineum: Normal  Assessment/Plan:  25 y.o. G0 for annual exam.   Well female exam with routine gynecological exam - Education provided on SBEs, importance of preventative screenings, current guidelines, high calcium diet, regular exercise, and multivitamin daily.  Anxiety with depression - Managed well with Lexapro. Refill x 1 year provided.   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   Screening for cervical cancer -  Normal pap history. Will repeat at 3-year interval per guidelines.      04/23/2020 [facility], 9:23 AM 05/05/2020

## 2020-05-13 ENCOUNTER — Telehealth: Payer: Self-pay | Admitting: Allergy

## 2020-05-13 MED ORDER — ALVESCO 160 MCG/ACT IN AERS
1.0000 | INHALATION_SPRAY | Freq: Two times a day (BID) | RESPIRATORY_TRACT | 5 refills | Status: DC
Start: 2020-05-13 — End: 2020-05-25

## 2020-05-13 NOTE — Telephone Encounter (Signed)
The Alvesco prescription goes to [facility] in [place] - they will call her to confirm insurance, and address.  Rx sent in.  If I send to [facility], they may not be able to process the coupon/discount correctly.

## 2020-05-13 NOTE — Telephone Encounter (Signed)
Pt was last seen 03/16/20 and was given a sample of Alvesco, pt states she has run out and would like a prescription sent in.  [facility] [address] [place], [place]  Pt would like to be contacted directly once prescription is sent in, [phone].  Please advise.

## 2020-05-13 NOTE — Telephone Encounter (Signed)
Is it ok to send in prescription for Alvesco?

## 2020-05-14 NOTE — Telephone Encounter (Signed)
Called and spoke to patient and informed her of the message per Dr. Selena Batten. Patient verbalized understanding and stated that she would call if any problems arise.

## 2020-05-25 ENCOUNTER — Ambulatory Visit: Payer: 59 | Admitting: Allergy

## 2020-05-25 ENCOUNTER — Encounter: Payer: Self-pay | Admitting: Allergy

## 2020-05-25 ENCOUNTER — Other Ambulatory Visit: Payer: Self-pay

## 2020-05-25 DIAGNOSIS — J3089 Other allergic rhinitis: Secondary | ICD-10-CM

## 2020-05-25 DIAGNOSIS — R12 Heartburn: Secondary | ICD-10-CM

## 2020-05-25 DIAGNOSIS — J45909 Unspecified asthma, uncomplicated: Secondary | ICD-10-CM

## 2020-05-25 MED ORDER — ALVESCO 160 MCG/ACT IN AERS: 1.0000 | INHALATION_SPRAY | Freq: Two times a day (BID) | RESPIRATORY_TRACT | 5 refills | Status: DC

## 2020-05-25 NOTE — Progress Notes (Signed)
Follow Up Note  RE: Gina Shepherd MRN: [id number] DOB: 27-Dec-1995 Date of Office Visit: 05/25/2020  Referring provider: Milus Height, PA Primary care provider: Milus Height, PA  Chief Complaint: Asthma (The Alvesco was working but the pharmacy that was suppose to call her never called her.)  History of Present Illness: I had the pleasure of seeing Gina Shepherd for a follow up visit at the [facility] on 05/25/2020. She is a 25 y.o. female, who is being followed for reactive airway disease, allergic rhinitis and heartburn. Her previous allergy office visit was on 03/16/2020 with Dr. Selena Batten. Today is a regular follow up visit.  Reactive airway disease  Patient used Alvesco 1 puff twice a day for 1 month and ran out about 1 month ago.  Noted improvement while on the inhaler.  No rescue inhaler use. Denies any ER/urgent care visits or prednisone use since the last visit. Still feels the sighing episodes and feels like she is not catching enough air.  Other allergic rhinitis Patient got dust mites covers with some benefit. Still taking Singulair at night and takes zyrtec prn with good benefit.  No Flonase use.   Heartburn Some heartburn symptoms - unchanged from last visit.   Assessment and Plan: Gina Shepherd is a 25 y.o. female with: Reactive airway disease without complication Past history - COVID-19 infection in October 2020 and started having issues with her breathing in March 2021 with chest tightness, shortness of breath, coughing, wheezing, and yawning especially at night.  1 episode of wheezing after playing a basketball game in June 2021. CXR at that time concerning for asthma. No prior diagnosis of asthma. Uses albuterol as needed and Singulair daily with some benefit. 1 course of prednisone. 2022 spirometry was normal with 6% improvement in FEV1 post bronchodilator treatment.  Interim history - felt better with Alvesco but there was issues regarding  the Rx. Today's spirometry was normal. Daily controller medication(s): Start Alvesco 1 puff twice a day with spacer and rinse mouth afterwards for 1 month. Sample given.  Let us know if it's not covered.  If the pharmacy doesn't call - please call them. May use albuterol rescue inhaler 2 puffs every 4 to 6 hours as needed for shortness of breath, chest tightness, coughing, and wheezing. May use albuterol rescue inhaler 2 puffs 5 to 15 minutes prior to strenuous physical activities. Monitor frequency of use.   If you are still having issues with your breathing then recommend to follow up with PCP as well.  Repeat spirometry at next visit.   Other allergic rhinitis Past history - Rhinitis symptoms for less than 1 year with worsening in the spring and fall.  Tried Zyrtec with good benefit.  2022 skin testing was positive to dust mites only. Interim history - got dust mites covers. Stable.   Continue environmental control measures as below.  May use over the counter antihistamines such as Zyrtec (cetirizine), Claritin (loratadine), Allegra (fexofenadine), or Xyzal (levocetirizine) daily as needed.  Continue Singulair (montelukast) 10mg  daily at night.  May use Flonase (fluticasone) nasal spray 1 spray per nostril twice a day as needed for nasal congestion.   Nasal saline spray (i.e., Simply Saline) or nasal saline lavage (i.e., NeilMed) is recommended as needed and prior to medicated nasal sprays.  Heartburn Past history - Some heartburn symptoms and takes OTC medications prn with good benefit. Interim history - unchanged.  See below for lifestyle and dietary modifications.  Uncontrolled reflux/heartburn can  also worsen respiratory symptoms. If no improvement with above regimen, will do a trial of PPI next.   Return in about 2 months (around 07/25/2020).  Meds ordered this encounter  Medications  . ciclesonide (ALVESCO) 160 MCG/ACT inhaler    Sig: Inhale 1 puff into the lungs 2  (two) times daily. with spacer and rinse mouth afterwards.    Dispense:  1 each    Refill:  5   Lab Orders  No laboratory test(s) ordered today    Diagnostics: Spirometry:  Tracings reviewed. Her effort: Good reproducible efforts. FVC: 3.32L FEV1: 2.89L, 84% predicted FEV1/FVC ratio: 87% Interpretation: Spirometry consistent with normal pattern.  Please see scanned spirometry results for details.  Medication List:  Current Outpatient Medications  Medication Sig Dispense Refill  . albuterol (VENTOLIN HFA) 108 (90 Base) MCG/ACT inhaler Inhale 1-2 puffs into the lungs every 6 (six) hours as needed for wheezing or shortness of breath. 18 g 0  . ciclesonide (ALVESCO) 160 MCG/ACT inhaler Inhale 1 puff into the lungs 2 (two) times daily. with spacer and rinse mouth afterwards. 1 each 5  . escitalopram (LEXAPRO) 10 MG tablet Take 1 tablet (10 mg total) by mouth daily. 90 tablet 3  . ibuprofen (ADVIL) 600 MG tablet Take 1 tablet (600 mg total) by mouth every 8 (eight) hours as needed. 60 tablet 1  . Lactobacillus (PROBIOTIC ACIDOPHILUS PO) Take by mouth.    . montelukast (SINGULAIR) 10 MG tablet Take 1 tablet (10 mg total) by mouth at bedtime. 30 tablet 0  . Multiple Vitamin (MULTI-VITAMIN DAILY PO) Take by mouth.     No current facility-administered medications for this visit.   Allergies: Allergies  Allergen Reactions  . Benzoyl Peroxide Other (See Comments)  . Epiduo [Adapalene-Benzoyl Peroxide] Swelling   I reviewed her past medical history, social history, family history, and environmental history and no significant changes have been reported from her previous visit.  Review of Systems  Constitutional: Negative for appetite change, chills, fever and unexpected weight change.  HENT: Negative for congestion and rhinorrhea.   Eyes: Negative for itching.  Respiratory: Positive for chest tightness, shortness of breath and wheezing. Negative for cough.   Cardiovascular: Negative for  chest pain.  Genitourinary: Negative for difficulty urinating.  Skin: Negative for rash.  Allergic/Immunologic: Positive for environmental allergies. Negative for food allergies.  Neurological: Negative for headaches.   Objective: LMP 04/28/2020  There is no height or weight on file to calculate BMI. Physical Exam Vitals and nursing note reviewed.  Constitutional:      Appearance: Normal appearance. She is well-developed.  HENT:     Head: Normocephalic and atraumatic.     Right Ear: Tympanic membrane and external ear normal.     Left Ear: Tympanic membrane and external ear normal.     Nose: Congestion (on right side) present.     Mouth/Throat:     Mouth: Mucous membranes are moist.     Pharynx: Oropharynx is clear.  Eyes:     Conjunctiva/sclera: Conjunctivae normal.  Cardiovascular:     Rate and Rhythm: Normal rate and regular rhythm.     Heart sounds: Normal heart sounds. No murmur heard. No friction rub. No gallop.   Pulmonary:     Effort: Pulmonary effort is normal.     Breath sounds: Normal breath sounds. No wheezing, rhonchi or rales.  Musculoskeletal:     Cervical back: Neck supple.  Skin:    General: Skin is warm.     Findings:  No rash.  Neurological:     Mental Status: She is alert and oriented to person, place, and time.  Psychiatric:        Behavior: Behavior normal.    Previous notes and tests were reviewed. The plan was reviewed with the patient/family, and all questions/concerned were addressed.  It was my pleasure to see Gina Shepherd today and participate in her care. Please feel free to contact me with any questions or concerns.  Sincerely,  Wyline Mood, DO Allergy & Immunology  [facility] office: [phone] [facility] office: [phone]

## 2020-05-25 NOTE — Assessment & Plan Note (Signed)
Past history - Rhinitis symptoms for less than 1 year with worsening in the spring and fall.  Tried Zyrtec with good benefit.  2022 skin testing was positive to dust mites only. Interim history - got dust mites covers. Stable.   Continue environmental control measures as below.  May use over the counter antihistamines such as Zyrtec (cetirizine), Claritin (loratadine), Allegra (fexofenadine), or Xyzal (levocetirizine) daily as needed.  Continue Singulair (montelukast) 10mg  daily at night.  May use Flonase (fluticasone) nasal spray 1 spray per nostril twice a day as needed for nasal congestion.   Nasal saline spray (i.e., Simply Saline) or nasal saline lavage (i.e., NeilMed) is recommended as needed and prior to medicated nasal sprays.

## 2020-05-25 NOTE — Assessment & Plan Note (Signed)
Past history - Some heartburn symptoms and takes OTC medications prn with good benefit. Interim history - unchanged.  See below for lifestyle and dietary modifications.  Uncontrolled reflux/heartburn can also worsen respiratory symptoms. If no improvement with above regimen, will do a trial of PPI next.

## 2020-05-25 NOTE — Patient Instructions (Addendum)
Breathing: Daily controller medication(s): Start Alvesco 1 puff twice a day with spacer and rinse mouth afterwards for 1 month. Sample given.  Let us know if it's not covered.  If the pharmacy doesn't call - please call them.  May use albuterol rescue inhaler 2 puffs every 4 to 6 hours as needed for shortness of breath, chest tightness, coughing, and wheezing. May use albuterol rescue inhaler 2 puffs 5 to 15 minutes prior to strenuous physical activities. Monitor frequency of use.  Breathing control goals:  Full participation in all desired activities (may need albuterol before activity) Albuterol use two times or less a week on average (not counting use with activity) Cough interfering with sleep two times or less a month Oral steroids no more than once a year No hospitalizations  If you are still having issues with your breathing then recommend to follow up with PCP as well.   Environmental allergies  2022 skin testing positive to dust mites.  Continue environmental control measures as below.  May use over the counter antihistamines such as Zyrtec (cetirizine), Claritin (loratadine), Allegra (fexofenadine), or Xyzal (levocetirizine) daily as needed.  Continue Singulair (montelukast) 10mg  daily at night.  May use Flonase (fluticasone) nasal spray 1 spray per nostril twice a day as needed for nasal congestion.   Nasal saline spray (i.e., Simply Saline) or nasal saline lavage (i.e., NeilMed) is recommended as needed and prior to medicated nasal sprays.  Heartburn: See below for lifestyle and dietary modifications.  Follow up in 2 months or sooner if needed.   Control of House Dust Mite Allergen . Dust mite allergens are a common trigger of allergy and asthma symptoms. While they can be found throughout the house, these microscopic creatures thrive in warm, humid environments such as bedding, upholstered furniture and carpeting. . Because so much time is spent in the bedroom,  it is essential to reduce mite levels there.  . Encase pillows, mattresses, and box springs in special allergen-proof fabric covers or airtight, zippered plastic covers.  . Bedding should be washed weekly in hot water (130 F) and dried in a hot dryer. Allergen-proof covers are available for comforters and pillows that can't be regularly washed.  the allergy-proof covers every few months. Minimize clutter in the bedroom. Keep pets out of the bedroom.  Reyes Ivan Keep humidity less than 50% by using a dehumidifier or air conditioning. You can buy a humidity measuring device called a hygrometer to monitor this.  . If possible, replace carpets with hardwood, linoleum, or washable area rugs. If that's not possible, vacuum frequently with a vacuum that has a HEPA filter. . Remove all upholstered furniture and non-washable window drapes from the bedroom. . Remove all non-washable stuffed toys from the bedroom.  Wash stuffed toys weekly.  Heartburn Heartburn is a type of pain or discomfort that can happen in your throat or chest. It is often described as a burning pain. It may also cause a bad, acid-like taste in your mouth. It may be caused by stomach contents that move back up (reflux) into the part of the body that moves food from your mouth to your stomach (esophagus). Heartburn may feel worse:  When you lie down.  When you bend over.  At night. Follow these instructions at home: Eating and drinking  Avoid certain foods and drinks as told by your doctor. This may include: ? Coffee and tea, with or without caffeine. ? Drinks that have alcohol. ? Energy drinks and sports drinks. ?  Carbonated drinks or sodas. ? Chocolate and cocoa. ? Peppermint and mint flavorings. ? Garlic and onions. ? Horseradish. ? Spicy and acidic foods, such as:  Peppers.  Chili powder and curry powder.  Vinegar.  Hot sauces and BBQ sauce. ? Citrus fruit juices and citrus fruits, such  as:  Oranges.  Lemons.  Limes. ? Tomato-based foods, such as:  Red sauce and pizza with red sauce.  Chili.  Salsa. ? Fried and fatty foods, such as:  Donuts.  [place] fries and potato chips.  High-fat dressings. ? High-fat meats, such as:  Hot dogs and sausage.  Rib eye steak.  Ham and bacon. ? High-fat dairy items, such as:  Whole milk.  Butter.  Cream cheese.  Eat small meals often. Avoid eating large meals.  Avoid drinking large amounts of liquid with your meals.  Avoid eating meals during the 2-3 hours before bedtime.  Avoid lying down right after you eat.  Do not exercise right after you eat.   Lifestyle  If you are overweight, lose an amount of weight that is healthy for you. Ask your doctor about a safe weight loss goal.  Do not smoke or use any products that contain nicotine or tobacco. These can make your symptoms worse. If you need help quitting, ask your doctor.  Wear loose clothes. Do not wear anything tight around your waist.  Raise (elevate) the head of your bed about 6 inches (15 cm) when you sleep. You can use a wedge to do this.  Try to lower your stress. If you need help doing this, ask your doctor.      Medicines  Take over-the-counter and prescription medicines only as told by your doctor.  Do not take aspirin or NSAIDs, such as ibuprofen, unless your doctor says it is okay.  Stop medicines only as told by your doctor. If you stop taking some medicines too quickly, your symptoms may get worse. General instructions  Watch for any changes in your symptoms.  Keep all follow-up visits. Contact a doctor if:  You have new symptoms.  You lose weight and you do not know why.  You have trouble swallowing, or it hurts to swallow.  You have wheezing or a cough that keeps happening.  Your symptoms do not get better with treatment.  You have heartburn often for more than 2 weeks. Get help right away if:  You have pain in your  arms, neck, jaw, teeth, or back all of a sudden.  You feel sweaty, dizzy, or light-headed all of a sudden.  You have chest pain or shortness of breath.  You vomit and your vomit looks like blood or coffee grounds.  Your poop (stool) is bloody or black. These symptoms may be an emergency. Get help right away. Call your local emergency services (911 in the [place]  Do not wait to see if the symptoms will go away.  Do not drive yourself to the hospital. Summary  Heartburn is a type of pain that can happen in your throat or chest. It can feel like a burning pain. It may also cause a bad, acid-like taste in your mouth.  You may need to avoid certain foods and drinks to help your symptoms. Ask your doctor what foods and drinks you should avoid.  Take over-the-counter and prescription medicines only as told by your doctor. Do not take aspirin or NSAIDs, such as ibuprofen, unless your doctor told you to do so.  Contact your doctor if your symptoms  do not get better or they get worse. This information is not intended to replace advice given to you by your health care provider. Make sure you discuss any questions you have with your health care provider. Document Revised: 08/20/2019 Document Reviewed: 08/20/2019 Elsevier Patient Education  [place].

## 2020-05-25 NOTE — Assessment & Plan Note (Signed)
Past history - COVID-19 infection in October 2020 and started having issues with her breathing in March 2021 with chest tightness, shortness of breath, coughing, wheezing, and yawning especially at night.  1 episode of wheezing after playing a basketball game in June 2021. CXR at that time concerning for asthma. No prior diagnosis of asthma. Uses albuterol as needed and Singulair daily with some benefit. 1 course of prednisone. 2022 spirometry was normal with 6% improvement in FEV1 post bronchodilator treatment.  Interim history - felt better with Alvesco but there was issues regarding the Rx. Today's spirometry was normal. Daily controller medication(s): Start Alvesco 1 puff twice a day with spacer and rinse mouth afterwards for 1 month. Sample given.  Let us know if it's not covered.  If the pharmacy doesn't call - please call them. May use albuterol rescue inhaler 2 puffs every 4 to 6 hours as needed for shortness of breath, chest tightness, coughing, and wheezing. May use albuterol rescue inhaler 2 puffs 5 to 15 minutes prior to strenuous physical activities. Monitor frequency of use.   If you are still having issues with your breathing then recommend to follow up with PCP as well.  Repeat spirometry at next visit.

## 2020-07-27 ENCOUNTER — Ambulatory Visit: Payer: 59 | Admitting: Allergy

## 2020-10-21 ENCOUNTER — Other Ambulatory Visit: Payer: Self-pay

## 2020-10-21 ENCOUNTER — Ambulatory Visit
Admission: RE | Admit: 2020-10-21 | Discharge: 2020-10-21 | Disposition: A | Discharge status: Home / Self Care | Payer: 59 | Source: Ambulatory Visit | Attending: Nurse Practitioner | Admitting: Nurse Practitioner

## 2020-10-21 DIAGNOSIS — N6324 Unspecified lump in the left breast, lower inner quadrant: Secondary | ICD-10-CM

## 2021-03-07 ENCOUNTER — Other Ambulatory Visit: Payer: Self-pay | Admitting: Nurse Practitioner

## 2021-03-07 DIAGNOSIS — Z09 Encounter for follow-up examination after completed treatment for conditions other than malignant neoplasm: Secondary | ICD-10-CM

## 2021-03-18 ENCOUNTER — Encounter: Payer: Self-pay | Admitting: Nurse Practitioner

## 2021-03-24 ENCOUNTER — Other Ambulatory Visit: Payer: Self-pay

## 2021-03-24 ENCOUNTER — Encounter: Payer: Self-pay | Admitting: Nurse Practitioner

## 2021-03-24 ENCOUNTER — Telehealth: Payer: Self-pay | Admitting: *Deleted

## 2021-03-24 ENCOUNTER — Ambulatory Visit: Payer: 59 | Admitting: Nurse Practitioner

## 2021-03-24 VITALS — BP 112/76

## 2021-03-24 DIAGNOSIS — N6324 Unspecified lump in the left breast, lower inner quadrant: Secondary | ICD-10-CM

## 2021-03-24 DIAGNOSIS — N926 Irregular menstruation, unspecified: Secondary | ICD-10-CM | POA: Diagnosis not present

## 2021-03-24 DIAGNOSIS — N6452 Nipple discharge: Secondary | ICD-10-CM

## 2021-03-24 LAB — PROLACTIN: Prolactin: 11.1 ng/mL

## 2021-03-24 LAB — PREGNANCY, URINE: Preg Test, Ur: NEGATIVE

## 2021-03-24 LAB — TSH: TSH: 2.1 mIU/L

## 2021-03-24 NOTE — Telephone Encounter (Signed)
-----   Message from Tamela Gammon, NP sent at 03/24/2021  8:48 AM EST ----- Regarding: Request earlier ultrasound appt and change to bilateral She is scheduled for follow up left breast ultrasound 2/23 but she is now having bilateral nipple discharge. Please try to move this appointment sooner and change to bilateral ultrasound. Thank you.

## 2021-03-24 NOTE — Telephone Encounter (Signed)
Breast center aware to update the order for right breast ultrasound as well. Patient still scheduled on 04/25/21, the breast center did not have anything sooner. Patient can call daily/weekly to check for any cancellations.  My chart message relaying this to patient as well.

## 2021-03-24 NOTE — Progress Notes (Signed)
° °  Acute Office Visit  Subjective:    Patient ID: Gina Shepherd, female    DOB: [birth year], 26 y.o.   MRN: [id number]   HPI 26 y.o. presents today for nipple discharge in left breast. Discharge is clear and she first noticed it a week ago. At first it was with expression and then later she noticed it was spontaneous. She then checked her right and had a very small amount of clear discharge with expression. 02/2020 she presented with lump in left breast with ultrasound 03/2020 showing probable benign mass, likely fibroadenoma 1.6 cm in size @ 7 o'clock position. 6 month follow up ultrasound 09/2020 showed stability of mass. Next ultrasound scheduled for 04/21/2021. She was 6 days late for her menses this month. Her last few cycles have been different than her normal with decreased flow and bleeding time. Her mom had periods during her pregnancies.    Review of Systems  Constitutional: Negative.   Genitourinary:  Positive for menstrual problem.  Hematological: Negative.   Left breast: Positive for lump and nipple discharge. Negative for redness, swelling, nipple inversion, or skin changes Right breast: Positive for nipple discharge. Negative for lump, redness, swelling, nipple inversion, or skin changes    Objective:    Physical Exam Constitutional:      Appearance: Normal appearance.  Chest:  Breasts:    Right: Nipple discharge present. No swelling, bleeding, inverted nipple, mass, skin change or tenderness.     Left: Mass (Subsequent encounter) and nipple discharge present. No swelling, bleeding, inverted nipple, skin change or tenderness.  GU: Not indicated  BP 112/76    LMP 03/18/2021  Wt Readings from Last 3 Encounters:  05/05/20 136 lb (61.7 kg)  03/17/20 138 lb 12.8 oz (63 kg)  03/16/20 142 lb 6.4 oz (64.6 kg)   UPT negative.      Assessment & Plan:   Problem List Items Addressed This Visit   None Visit Diagnoses     Nipple discharge    -  Primary   Relevant Orders    Prolactin   TSH   Pregnancy, urine   Late menses       Relevant Orders   Prolactin   TSH   Pregnancy, urine   Breast lump on left side at 7 o'clock position          Plan: UPT negative. Will check prolactin and TSH. Will try to schedule her ultrasound sooner than 2/23 and request ultrasound of both breasts.     Olivia Mackie DNP, 8:47 AM 03/24/2021

## 2021-03-29 ENCOUNTER — Other Ambulatory Visit: Payer: Self-pay | Admitting: Nurse Practitioner

## 2021-03-29 DIAGNOSIS — Z09 Encounter for follow-up examination after completed treatment for conditions other than malignant neoplasm: Secondary | ICD-10-CM

## 2021-03-29 DIAGNOSIS — N6452 Nipple discharge: Secondary | ICD-10-CM

## 2021-03-29 NOTE — Telephone Encounter (Signed)
Patient called back stating breast center said the order needs to be changed. I called and spoke with Arkinia and explained that patient has bilateral nipple discharge. Arkinia spoke with her clinic manager and was told per radiology protocol patient will still need to come in on 04/25/21 for 6 month follow up.Arkinia relayed to me that patient can come in on 04/04/21 to address bilateral nipple discharge only , but will need to return for the left breast 6 month follow up scheduled on 04/25/21. Jiles Harold also said patient may receive a bill from her insurance. The other option would be to wait until 04/25/21 to address bilateral nipple discharge and 6 month follow up. I discussed all the above with patient and she wanted to schedule on 04/04/21 to address bilateral nipple discharge.  And will return for 6 month follow as well on 04/25/21 I once again told her she may receive a bill with insurance, patient verbalized she understood. I called Arkinia back and told her patient would like to schedule for 04/04/21 and she is aware of the information she told me.   Patient is scheduled on 04/04/21. Will route to [facility] for FYI and to cosign the order.

## 2021-04-04 ENCOUNTER — Ambulatory Visit
Admission: RE | Admit: 2021-04-04 | Discharge: 2021-04-04 | Disposition: A | Discharge status: Home / Self Care | Payer: 59 | Source: Ambulatory Visit | Attending: Nurse Practitioner | Admitting: Nurse Practitioner

## 2021-04-04 ENCOUNTER — Other Ambulatory Visit: Payer: Self-pay | Admitting: Nurse Practitioner

## 2021-04-04 DIAGNOSIS — N6452 Nipple discharge: Secondary | ICD-10-CM

## 2021-04-04 DIAGNOSIS — N6320 Unspecified lump in the left breast, unspecified quadrant: Secondary | ICD-10-CM

## 2021-04-05 NOTE — Telephone Encounter (Signed)
Patient had both ultrasounds done and is now scheduled on 04/14/21 for breast biopsy.

## 2021-04-07 ENCOUNTER — Ambulatory Visit
Admission: RE | Admit: 2021-04-07 | Discharge: 2021-04-07 | Disposition: A | Discharge status: Home / Self Care | Payer: 59 | Source: Ambulatory Visit | Attending: Nurse Practitioner | Admitting: Nurse Practitioner

## 2021-04-07 DIAGNOSIS — N6320 Unspecified lump in the left breast, unspecified quadrant: Secondary | ICD-10-CM

## 2021-04-14 ENCOUNTER — Other Ambulatory Visit: Payer: 59

## 2021-04-25 ENCOUNTER — Other Ambulatory Visit: Payer: 59

## 2021-04-28 IMAGING — US US BREAST*L* LIMITED INC AXILLA
1 series · 5 of 5 positions shown · non-contrast
Comparison: None.

CLINICAL DATA: 24-year-old female with a new lump in the left
breast for approximately 1 month.

EXAM:
ULTRASOUND OF THE LEFT BREAST

[Series 1: us breast*left* limited inc axilla · 0.07mm/px · 5 of 5 slices shown]
[im 1/5]
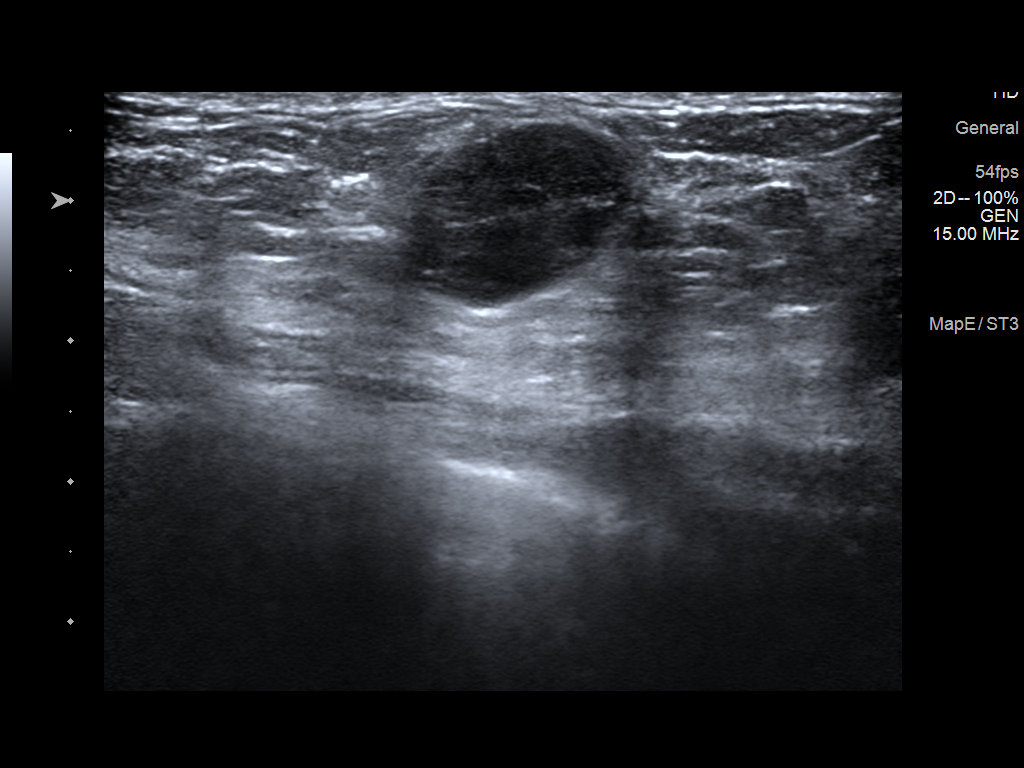
[im 2/5]
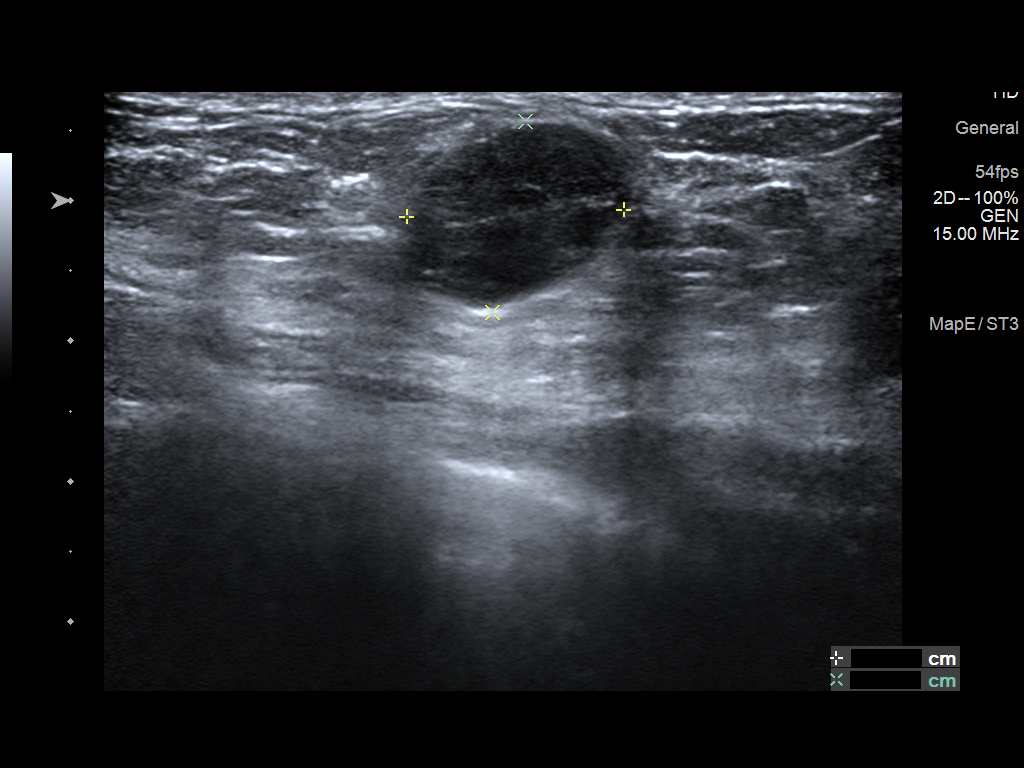
[im 3/5]
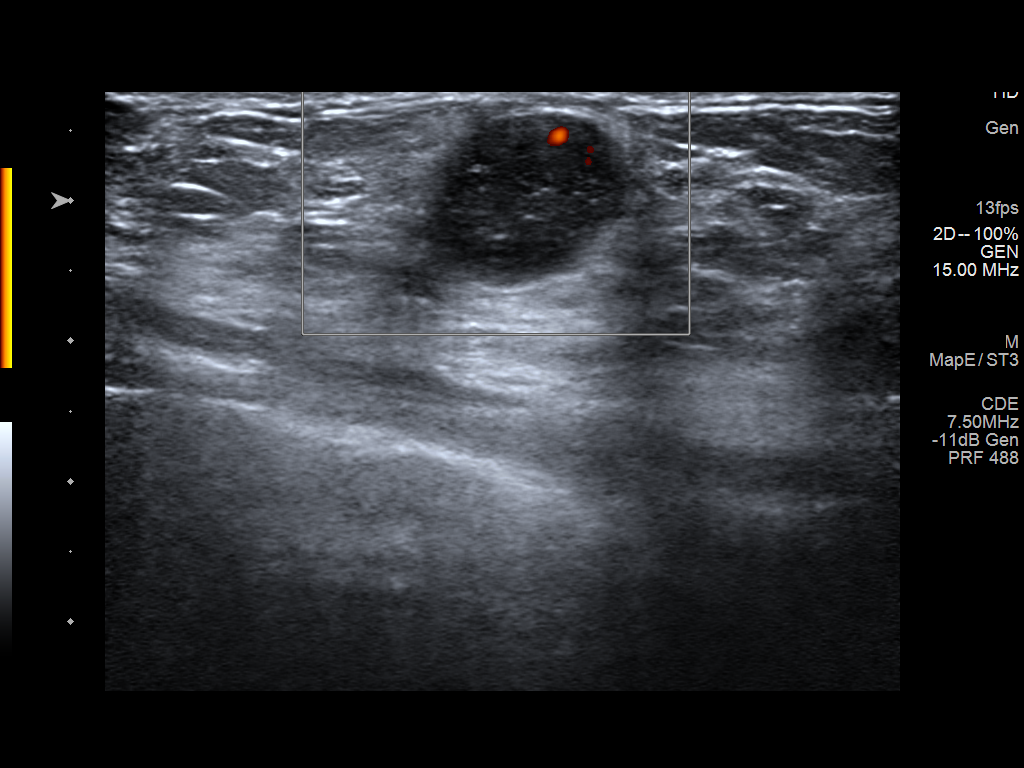
[im 4/5]
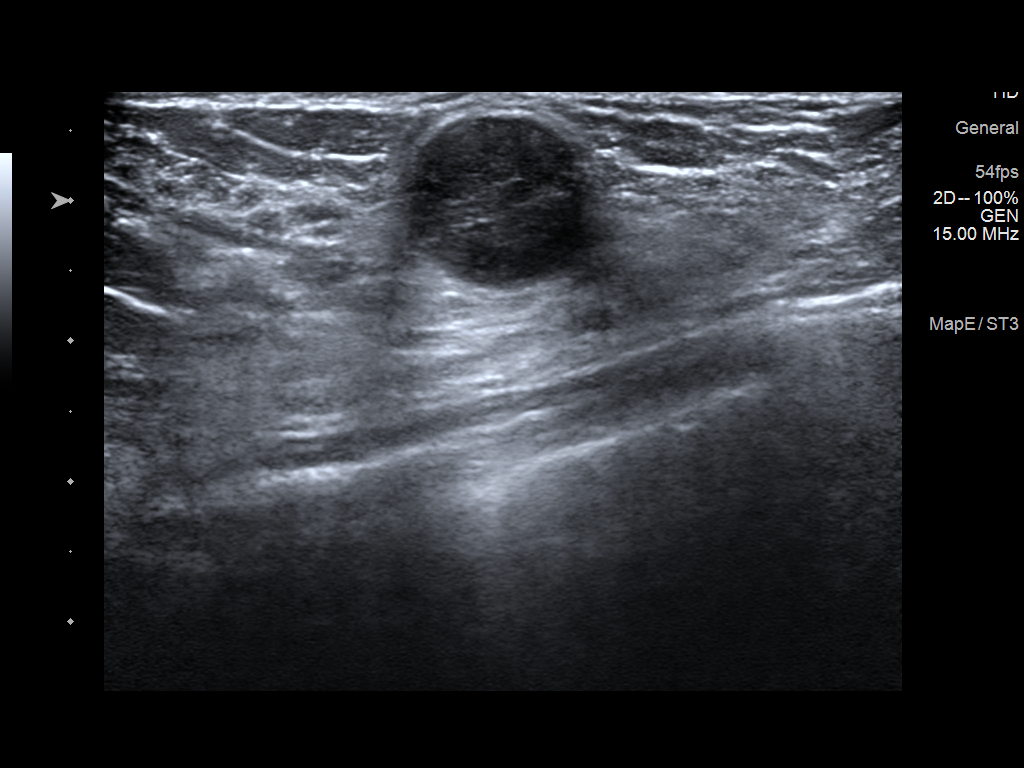
[im 5/5]
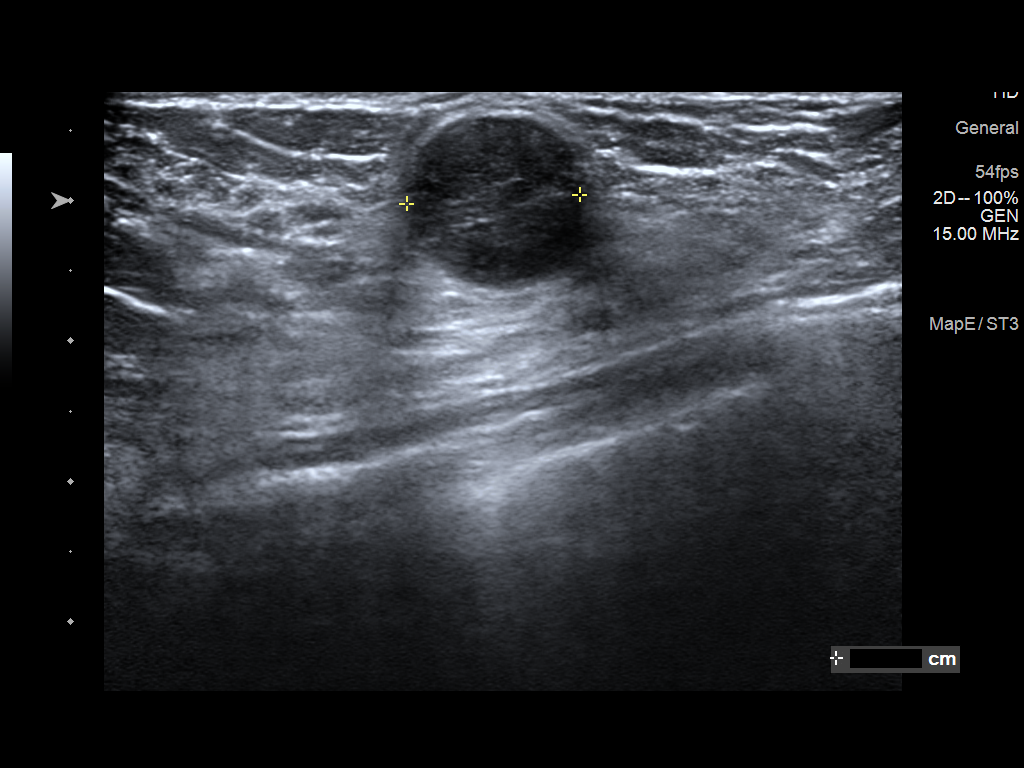

[5 of 5 positions shown; findings below may reference images not displayed]

FINDINGS: On physical exam, I feel a smooth mobile mass at the site of concern
reported by the patient in the inferior left breast.

Targeted ultrasound is performed at the palpable site of concern at
6 o'clock 5 cm from the nipple demonstrating an oval circumscribed
hypoechoic mass measuring 1.6 x 1.4 x 1.2 cm, likely a fibroadenoma.
There is a small amount of internal blood flow.
IMPRESSION: At the palpable site in the left breast at 6 o'clock there is a
probably benign mass measuring 1.6 cm, likely a fibroadenoma.

RECOMMENDATION:
Left breast ultrasound in 6 months. Discussed with patient the
options of short-term follow-up versus biopsy. The patient agrees to
proceed with short-term follow-up. She will monitor for interval
growth at home.

BI-RADS CATEGORY  3: Probably benign.

## 2021-05-09 ENCOUNTER — Encounter: Payer: Self-pay | Admitting: Nurse Practitioner

## 2021-05-09 ENCOUNTER — Other Ambulatory Visit (HOSPITAL_COMMUNITY)
Admission: RE | Admit: 2021-05-09 | Discharge: 2021-05-09 | Disposition: A | Discharge status: Home / Self Care | Payer: 59 | Source: Ambulatory Visit | Attending: Nurse Practitioner | Admitting: Nurse Practitioner

## 2021-05-09 ENCOUNTER — Ambulatory Visit (INDEPENDENT_AMBULATORY_CARE_PROVIDER_SITE_OTHER): Payer: 59 | Admitting: Nurse Practitioner

## 2021-05-09 ENCOUNTER — Other Ambulatory Visit: Payer: Self-pay

## 2021-05-09 VITALS — BP 118/66 | Ht 64.0 in | Wt 142.0 lb

## 2021-05-09 DIAGNOSIS — Z01419 Encounter for gynecological examination (general) (routine) without abnormal findings: Secondary | ICD-10-CM | POA: Insufficient documentation

## 2021-05-09 DIAGNOSIS — F418 Other specified anxiety disorders: Secondary | ICD-10-CM | POA: Diagnosis not present

## 2021-05-09 MED ORDER — ESCITALOPRAM OXALATE 10 MG PO TABS: 10.0000 mg | ORAL_TABLET | Freq: Every day | ORAL | 3 refills | Status: DC

## 2021-05-09 NOTE — Progress Notes (Signed)
? ?  Gina Shepherd 11-07-1995 [id number] ? ? ?History:  26 y.o. G0 presents for annual exam. Monthly cycles. Normal pap history. Has received Gardasil series. History of left breast fibroadenoma. Was having bilateral nipple discharge in January. Breast imaging showed interval increase in size of fibroadenoma compared to, biopsy again confirmed fibroadenoma. Anxiety and depression managed well with Lexapro. ? ?Gynecologic History ?Patient's last menstrual period was 05/05/2021. ?Period Cycle (Days): 28 ?Period Duration (Days): 6 ?Period Pattern: Regular ?Menstrual Flow: Moderate ?Dysmenorrhea: (!) Moderate ?Dysmenorrhea Symptoms: Cramping ?Contraception/Family planning: condoms ?Sexually active: Yes ? ?Health Maintenance ?Last Pap: 04/22/2018. Results were: Normal ?Last mammogram/ultrasound: 04/04/2021. Results were: Interval increase in left breast fibroadenoma ?Last colonoscopy: Not indicated ?Last Dexa: Not indicated ? ?Past medical history, past surgical history, family history and social history were all reviewed and documented in the EPIC chart. Married. Works for [employer]  ? ?ROS:  A ROS was performed and pertinent positives and negatives are included. ? ?Exam: ? ?Vitals:  ? 05/09/21 0852  ?BP: 118/66  ?Weight: 142 lb (64.4 kg)  ?Height: 5\' 4"  (1.626 m)  ? ? ?Body mass index is 24.37 kg/m?. ? ?General appearance:  Normal ?Thyroid:  Symmetrical, normal in size, without palpable masses or nodularity. ?Respiratory ? Auscultation:  Clear without wheezing or rhonchi ?Cardiovascular ? Auscultation:  Regular rate, without rubs, murmurs or gallops ? Edema/varicosities:  Not grossly evident ?Abdominal ? Soft,nontender, without masses, guarding or rebound. ? Liver/spleen:  No organomegaly noted ? Hernia:  None appreciated ? Skin ? Inspection:  Grossly normal ?  ?Breasts: Examined lying and sitting.  ? Right: Without masses, retractions, discharge or axillary adenopathy. ? ? Left: Left breast fibroadenoma about 2  cm @ 6 o'clock position 5 cm from nipple. No retractions, discharge or axillary adenopathy. ?Genitourinary  ? Inguinal/mons:  Normal without inguinal adenopathy ? External genitalia:  Normal appearing vulva with no masses, tenderness, or lesions ? BUS/Urethra/Skene's glands:  Normal ? Vagina:  Normal appearing with normal color and discharge, no lesions ? Cervix:  Normal appearing without discharge or lesions ? Uterus:  Normal in size, shape and contour.  Midline and mobile, nontender ? Adnexa/parametria:   ?  Rt: Normal in size, without masses or tenderness. ?  Lt: Normal in size, without masses or tenderness. ? Anus and perineum: Normal ? Digital rectal exam: Not indicated ? ?Patient informed chaperone available to be present for breast and pelvic exam. Patient has requested no chaperone to be present. Patient has been advised what will be completed during breast and pelvic exam.  ? ?Assessment/Plan:  26 y.o. G0 for annual exam.  ? ?Well female exam with routine gynecological exam - Plan: Cytology - PAP( [place]). Education provided on SBEs, importance of preventative screenings, current guidelines, high calcium diet, regular exercise, and multivitamin daily. ? ?Anxiety with depression - Plan: escitalopram (LEXAPRO) 10 MG tablet daily. Doing well and wants to continue. Refill x 1 year provided.   ? ?Screening for cervical cancer -  Normal pap history. Pap today.  ? ?Return in 1 year for annual or sooner if needed.  ? ? ? ? ?[address] [facility], 9:07 AM 05/09/2021 ? ?

## 2021-05-10 LAB — CYTOLOGY - PAP: Diagnosis: NEGATIVE

## 2022-02-27 NOTE — L&D Delivery Note (Signed)
Delivery Note At 12:22 PM a viable and healthy female was delivered via Vaginal, Spontaneous (Presentation: Left Occiput Anterior).  APGAR: , ; weight 6 lb 4.9 oz (2860 g).   Placenta status: Spontaneous, Intact, to pathology  Cord: 3 vessels   The patient pushed for approximately 10 minutes and delivered a vigorous female infant in the vertex occiput anterior presentation with restitution to left occiput anterior.  The anterior shoulder quickly delivered followed by the body.  The infant was then passed to the waiting maternal abdomen and was bulb suctioned and dried.  The infant was noted to be warm to touch at the time of delivery.  Terminal meconium was noted at the time of delivery.  Following a 1 minute delay, the cord was clamped and cut.  The placenta delivered spontaneously, intact, with three-vessel cord.  A partial third-degree tear involving the external portion of the external sphincter was noted.  Allis clamps x 2 were used to grasp the edges of the sphincter muscle and 0 Vicryl interrupted sutures were used to reapproximate the external sphincter.  The remaining second-degree perineal laceration was repaired in the normal fashion.  All sponge, needle, instrument counts were correct.  Baby had extended transition time to maintain oxygen saturation and the NICU nurse practitioner was called to evaluate the infant.  Ultimately the infant remained at the bedside with mom.  Mom and baby are doing well following delivery.  EBL 387 cc.  Final Apgars have not been documented at the time of this dictation   Anesthesia: Epidural Episiotomy: None Lacerations: 3rd degree, partial 3rd degree. 3A Suture Repair: 3.0 vicryl 0 vicryl Est. Blood Loss (mL): 387  Mom to postpartum.  Baby to Couplet care / Skin to Skin.  Waynard Reeds 02/13/2023, 1:08 PM

## 2022-04-11 IMAGING — US US BREAST*L* LIMITED INC AXILLA
2 series · 8 of 8 positions shown · non-contrast
Comparison: With priors.

CLINICAL DATA: Short-term interval follow-up of a probable benign
left breast mass. Patient complains of bilateral expressed nipple
discharge.

EXAM:
ULTRASOUND OF THE BILATERAL BREAST

[Series 1: us breast*left* limited inc axilla · 0.06mm/px · 6 of 6 slices shown (1 of 2)]
[im 1/6]
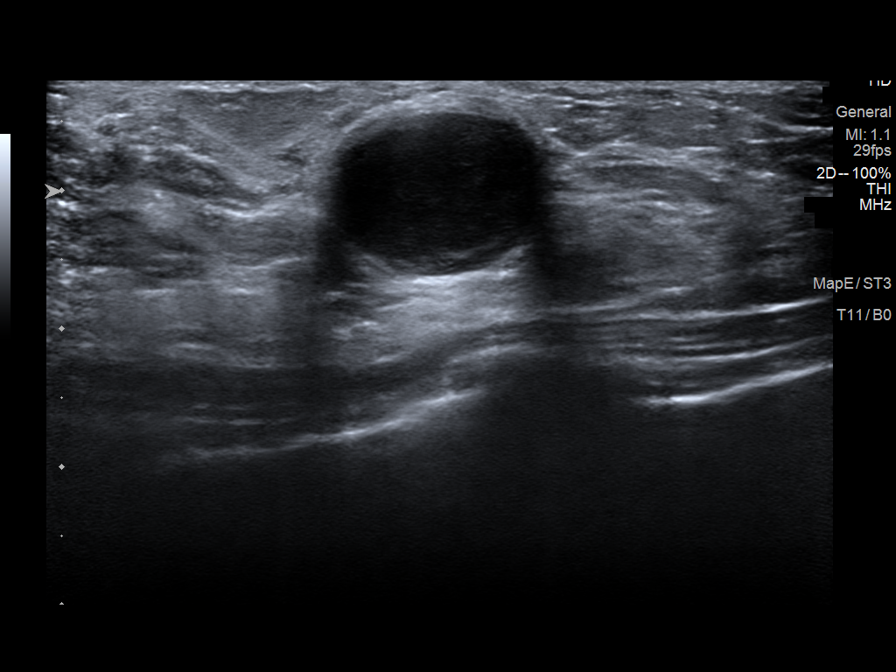
[im 2/6]
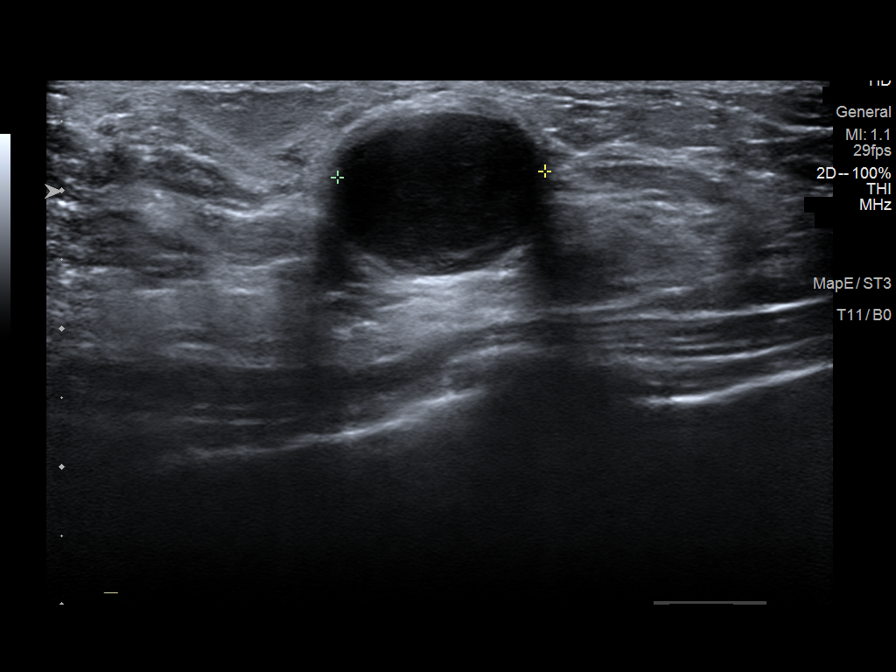
[im 3/6]
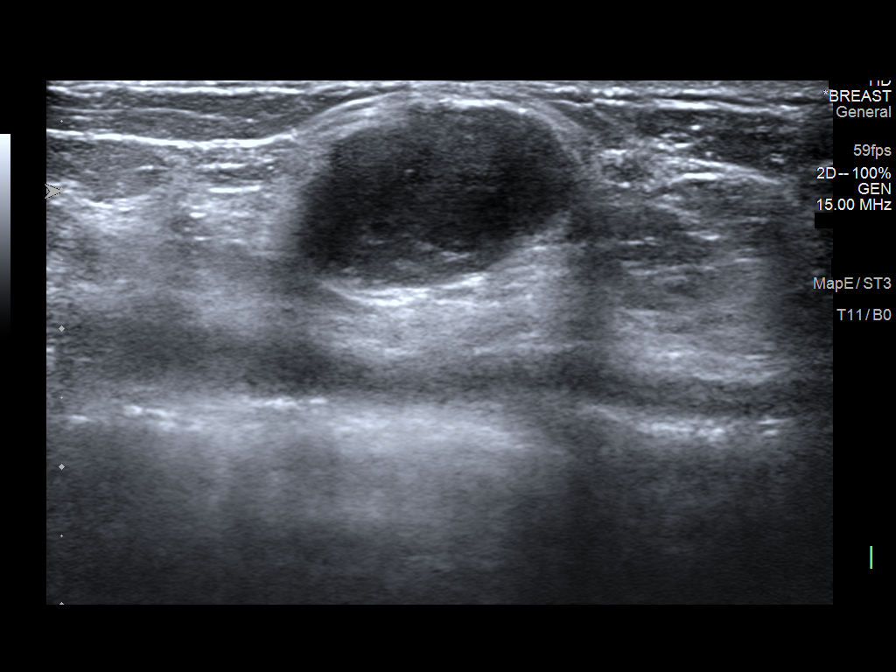
[im 4/6]
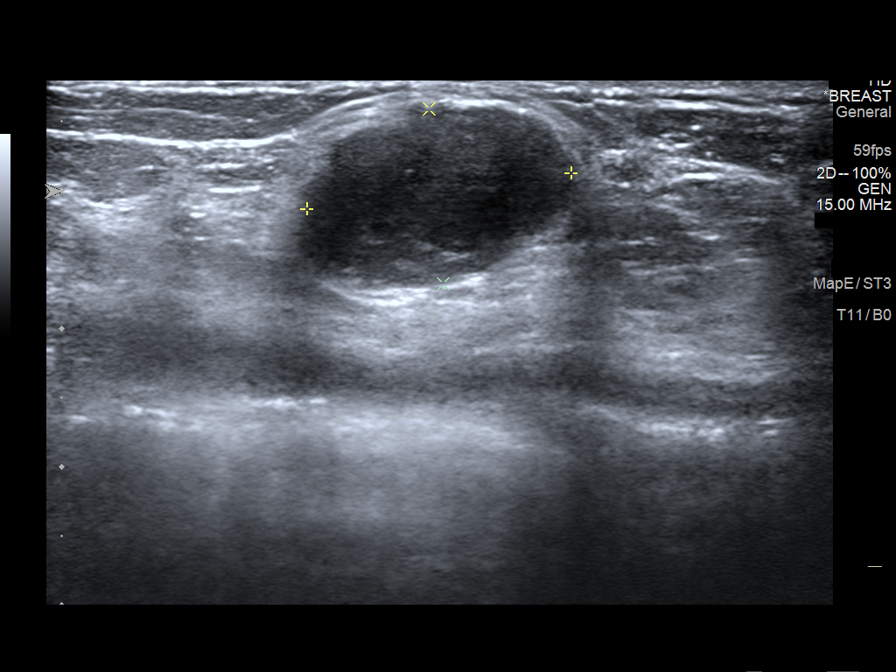
[im 5/6]
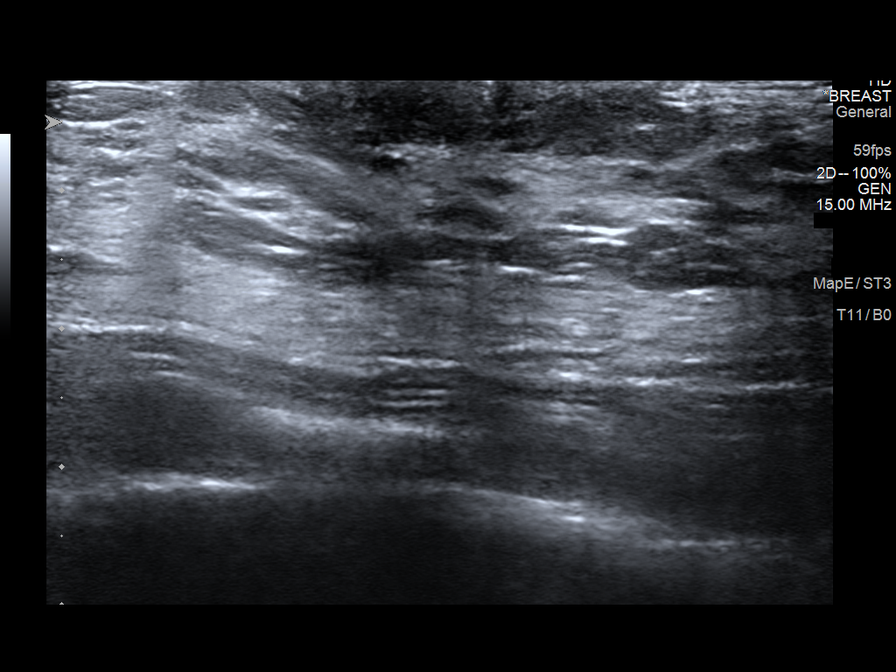
[im 6/6]
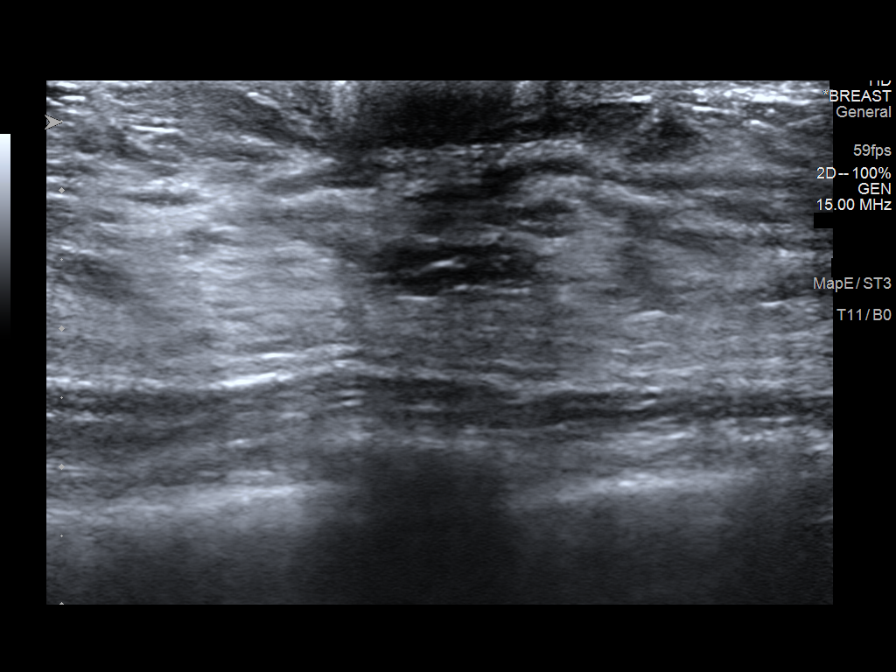

[Series 2: us breast*left* limited inc axilla · 0.06mm/px · 2 of 2 slices shown (2 of 2)]
[im 1/2]
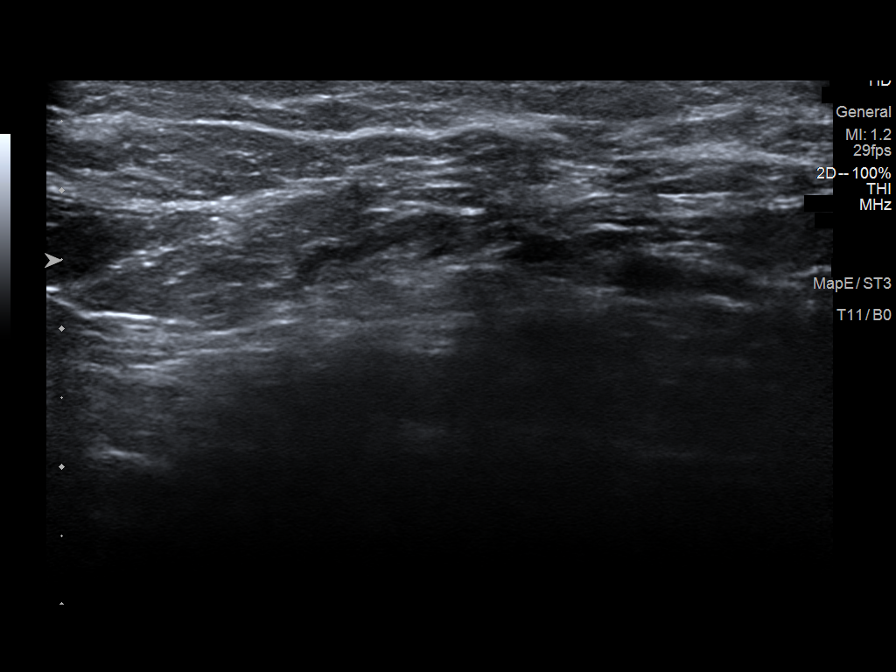
[im 2/2]
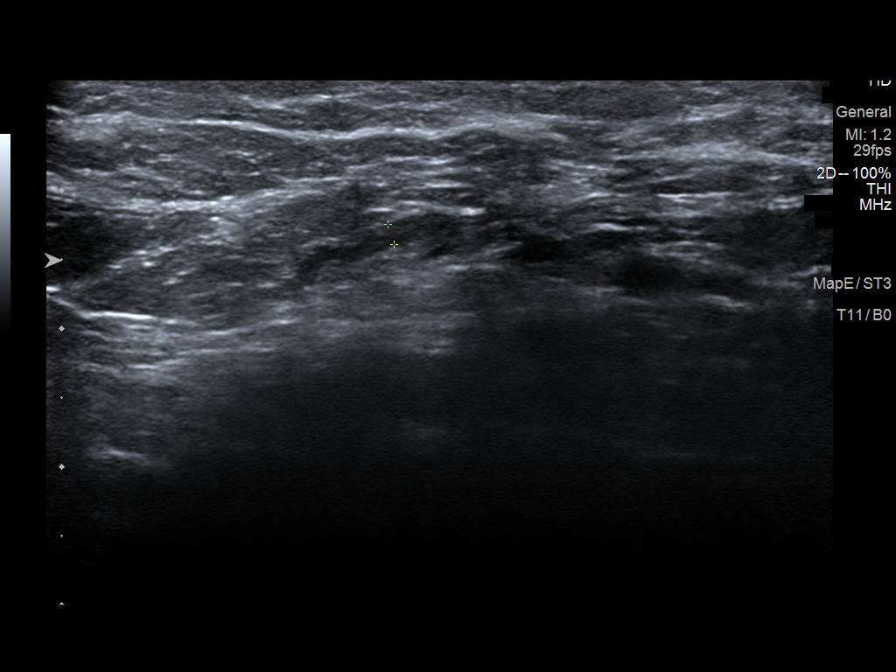

[8 of 8 positions shown; findings below may reference images not displayed]

FINDINGS: On physical exam, I palpate a discrete mass in the left breast at 6
o'clock 5 cm from the nipple.

Targeted ultrasound is performed, showing a well-circumscribed
hypoechoic mass in left breast at 6 o'clock 5 cm from the nipple
measuring 1.5 x 1.9 x 1.3 cm. On the prior ultrasound dated
04/21/2020 it measured 1.2 x 1.5 x 1.4 cm. Sonographic evaluation of
the retroareolar regions of both breast shows normal fibroglandular
tissue. Sonographic evaluation the left axilla shows normal lymph
nodes.
IMPRESSION: Interval increase in size of the mass in the 6 o'clock region of the
left breast.

RECOMMENDATION:
Ultrasound-guided core biopsy of the mass in the 6 o'clock region of
the left breast is recommended. The biopsy will be scheduled the
patient's convenience.

I have discussed the findings and recommendations with the patient.
If applicable, a reminder letter will be sent to the patient
regarding the next appointment.

BI-RADS CATEGORY  4: Suspicious.

## 2022-04-13 ENCOUNTER — Encounter: Payer: Self-pay | Admitting: Nurse Practitioner

## 2022-04-14 IMAGING — US US BREAST BX W LOC DEV 1ST LESION IMG BX SPEC US GUIDE*L*
1 series · 12 of 14 positions shown · non-contrast
Comparison: Previous exam(s).
COMPARISON: Previous exam(s).

Addendum:
CLINICAL DATA: 25-year-old with an indeterminate enlarging 1.9 cm
mass involving the lower LEFT breast at the 6 o'clock position 5 cm
from nipple.

EXAM:
ULTRASOUND GUIDED LEFT BREAST CORE NEEDLE BIOPSY

[Series 1: us breast bx w loc dev 1st lesion img bx spec us g · 0.07mm/px · 12 of 14 slices shown]
[im 1/14]
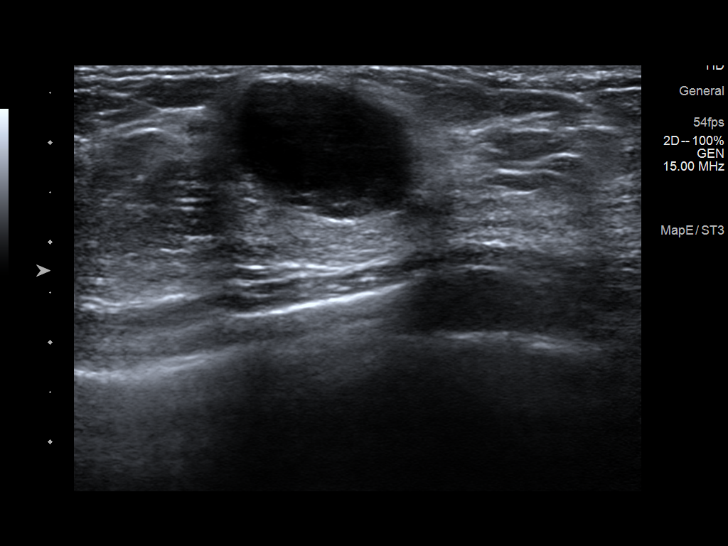
[im 2/14]
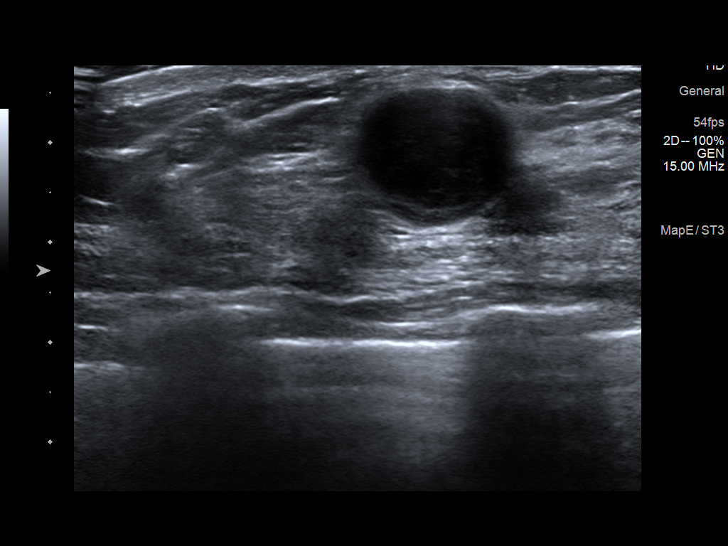
[im 3/14]
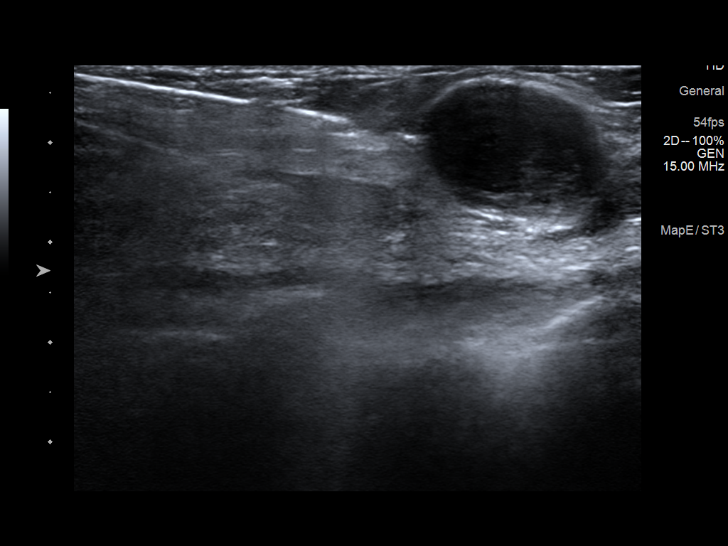
[im 5/14]
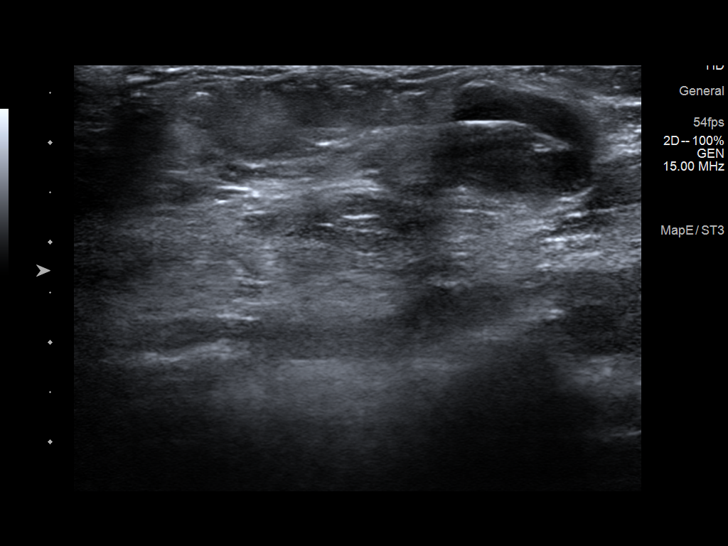
[im 6/14]
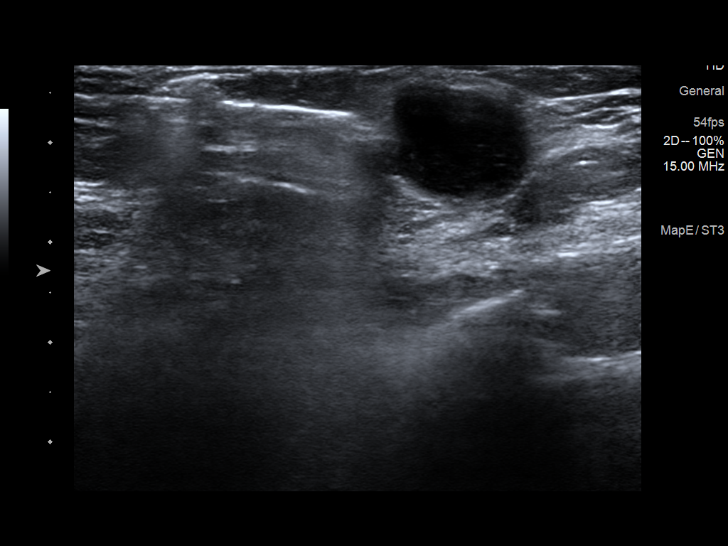
[im 7/14]
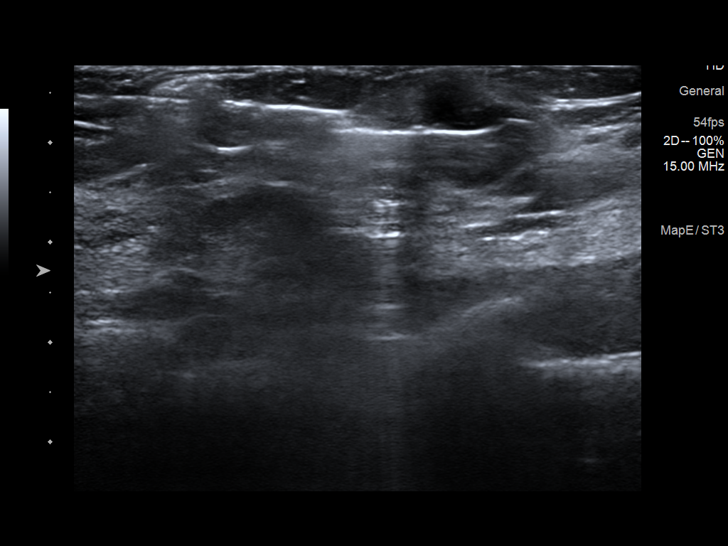
[im 8/14]
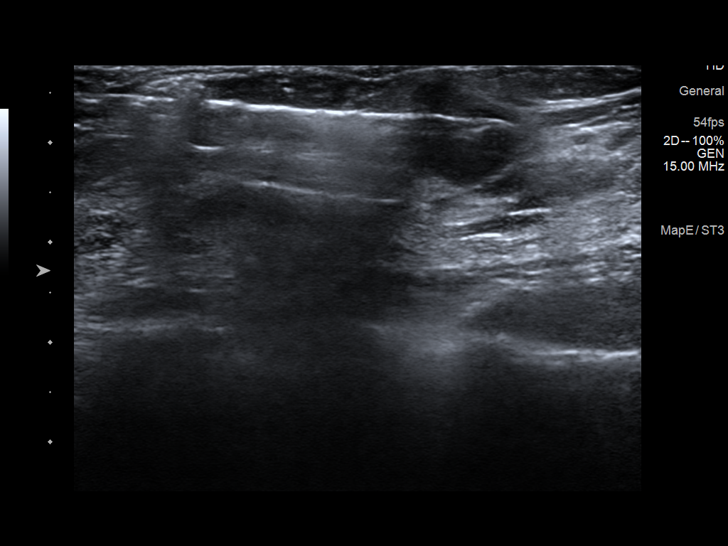
[im 9/14]
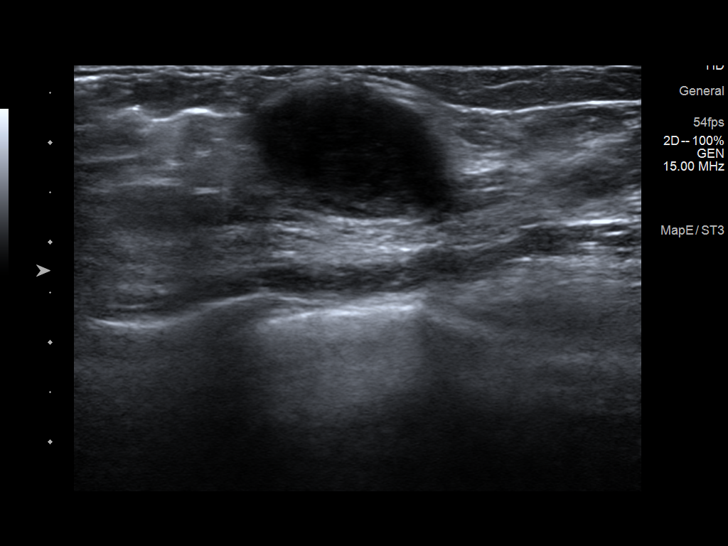
[im 10/14]
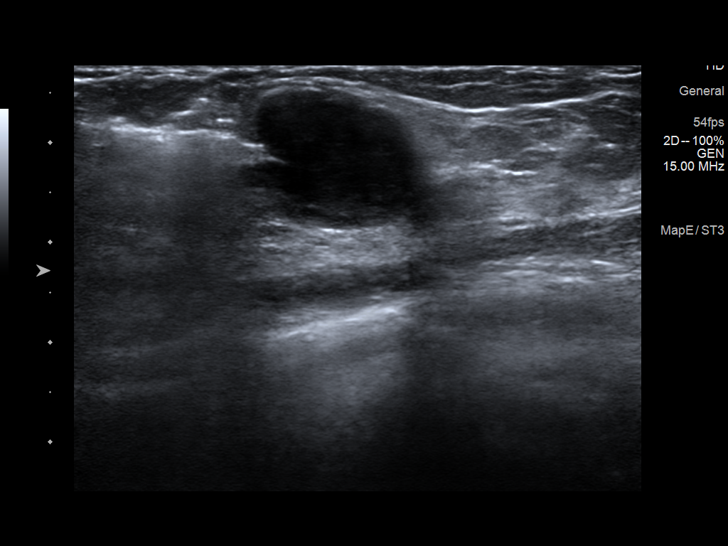
[im 12/14]
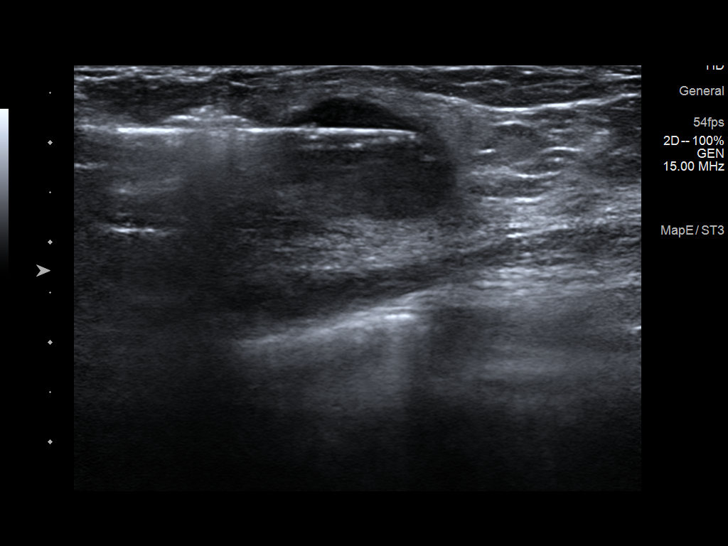
[im 13/14]
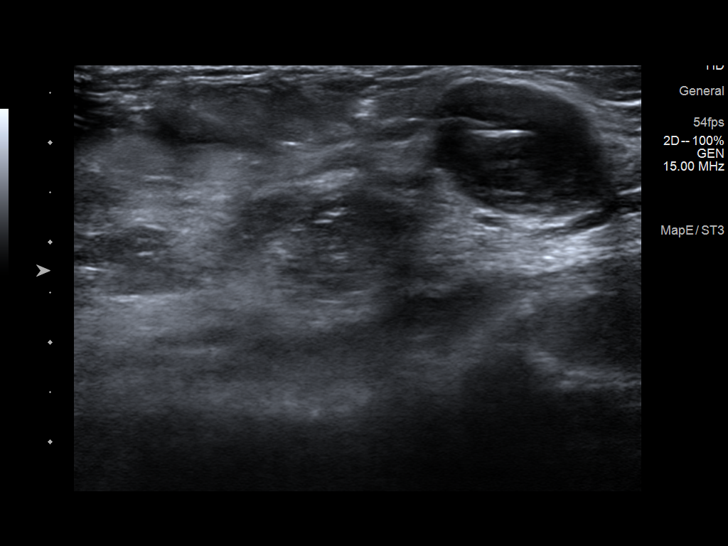
[im 14/14]
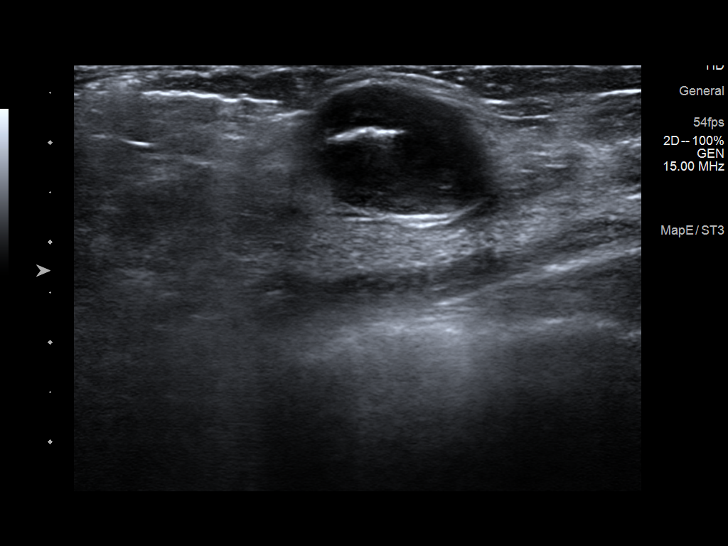

[12 of 14 positions shown; findings below may reference images not displayed]



Lesion quadrant: Lower breast, 6 o'clock location.

Using sterile technique with chlorhexidine as skin antisepsis, 1%
Lidocaine as local anesthetic, under direct ultrasound
visualization, a 12 gauge Precious Presh Zamile core needle device placed
through an 11 gauge introducer needle was used to perform biopsy of
the mass in the lower breast using a lateral approach. At the
conclusion of the procedure a ribbon shaped tissue marker clip was
deployed into the biopsy cavity. A follow-up mammogram was not
performed due the patient's age.
IMPRESSION: Ultrasound guided biopsy of a 1.9 cm mass in the lower LEFT breast
at the 6 o'clock position 5 cm from the nipple. No apparent
complications.

ADDENDUM:
Pathology revealed FIBROADENOMA of the LEFT breast, 6 o'clock 0cmfn,
(ribbon clip). This was found to be concordant by Dr. Niklas
Habesha.

Pathology results were discussed with the patient by telephone. The
patient reported doing well after the biopsy with tenderness at the
site. Post biopsy instructions and care were reviewed and questions
were answered. The patient was encouraged to call The [REDACTED] for any additional concerns. My direct phone
number was provided.

The patient was instructed to continue with monthly self breast
examinations, clinical follow-up as needed, and to return for annual
mammography at 38, unless instructed otherwise.

Pathology results reported by Anybal Atz Balan, RN on 04/08/2021.



Lesion quadrant: Lower breast, 6 o'clock location.

Using sterile technique with chlorhexidine as skin antisepsis, 1%
Lidocaine as local anesthetic, under direct ultrasound
visualization, a 12 gauge Precious Presh Zamile core needle device placed
through an 11 gauge introducer needle was used to perform biopsy of
the mass in the lower breast using a lateral approach. At the
conclusion of the procedure a ribbon shaped tissue marker clip was
deployed into the biopsy cavity. A follow-up mammogram was not
performed due the patient's age.
IMPRESSION: Ultrasound guided biopsy of a 1.9 cm mass in the lower LEFT breast
at the 6 o'clock position 5 cm from the nipple. No apparent
complications.

## 2022-04-14 NOTE — Telephone Encounter (Signed)
Gina Shepherd, CMA Left message for patient to call and schedule appointment.

## 2022-05-10 ENCOUNTER — Ambulatory Visit: Payer: 59 | Admitting: Nurse Practitioner

## 2022-05-10 ENCOUNTER — Encounter: Payer: Self-pay | Admitting: Nurse Practitioner

## 2022-05-10 VITALS — BP 112/78 | HR 83

## 2022-05-10 DIAGNOSIS — Z3169 Encounter for other general counseling and advice on procreation: Secondary | ICD-10-CM | POA: Diagnosis not present

## 2022-05-10 NOTE — Progress Notes (Signed)
   Acute Office Visit  Subjective:    Patient ID: Gina Shepherd, female    DOB: 05/23/1995, 27 y.o.   MRN: [id number]   HPI [name] presents today for preconception counseling. Regular monthly cycles. Tracking ovulation with [facility] app. Started prenatal vitamin a month ago. Has questions about continuing Lexapro. She has been on Lexapro since 2019. Only SSRI she has tried and it has worked well.    Review of Systems  Constitutional: Negative.   Genitourinary: Negative.        Objective:    Physical Exam Constitutional:      Appearance: Normal appearance.   GU: Not indicated  BP 112/78   Pulse 83   LMP 05/10/2022 (Exact Date)   SpO2 98%  Wt Readings from Last 3 Encounters:  05/09/21 142 lb (64.4 kg)  05/05/20 136 lb (61.7 kg)  03/17/20 138 lb 12.8 oz (63 kg)       Assessment & Plan:   Problem List Items Addressed This Visit   None Visit Diagnoses     Encounter for preconception consultation    -  Primary      Plan: Continue PNV and cycle tracking. Discussed healthy behaviors for pregnancy promotion. Lexapro use discussed. Will try to wean by alternating 10 mg and 5 mg every other day x 1-2 weeks, then decrease to 5 mg daily. If tolerating may continue to wean altogether. If she does not tolerate we can switch try Zoloft as alternative. All questions answered.      Tamela Gammon DNP, 2:10 PM 05/10/2022

## 2022-05-15 ENCOUNTER — Ambulatory Visit (INDEPENDENT_AMBULATORY_CARE_PROVIDER_SITE_OTHER): Payer: 59 | Admitting: Nurse Practitioner

## 2022-05-15 ENCOUNTER — Encounter: Payer: Self-pay | Admitting: Nurse Practitioner

## 2022-05-15 VITALS — BP 110/64 | HR 72 | Ht 65.0 in | Wt 147.0 lb

## 2022-05-15 DIAGNOSIS — Z01419 Encounter for gynecological examination (general) (routine) without abnormal findings: Secondary | ICD-10-CM | POA: Diagnosis not present

## 2022-05-15 DIAGNOSIS — F418 Other specified anxiety disorders: Secondary | ICD-10-CM | POA: Diagnosis not present

## 2022-05-15 MED ORDER — ESCITALOPRAM OXALATE 10 MG PO TABS: 10.0000 mg | ORAL_TABLET | Freq: Every day | ORAL | 3 refills | Status: DC

## 2022-05-15 NOTE — Progress Notes (Signed)
   Gina Shepherd 1995/11/08 [id number]   History:  [name] presents for annual exam. Monthly cycles. Normal pap history. Has received Gardasil series. Anxiety and depression managed well with Lexapro. Seen last week for preconception counseling.  Gynecologic History Patient's last menstrual period was 05/10/2022 (exact date).   Contraception/Family planning: none Sexually active: Yes  Health Maintenance Last Pap: 05/09/2021. Results were: Normal Last mammogram/ultrasound: 04/04/2021. 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 Married. Works for [employer]   ROS:  A ROS was performed and pertinent positives and negatives are included.  Exam:  Vitals:   05/15/22 0806  BP: 110/64  Pulse: 72  SpO2: 100%  Weight: 147 lb (66.7 kg)  Height: 5\' 5"  (1.651 m)     Body mass index is 24.46 kg/m.  General appearance:  Normal Thyroid:  Symmetrical, normal in size, without palpable masses or nodularity. Respiratory  Auscultation:  Clear without wheezing or rhonchi Cardiovascular  Auscultation:  Regular rate, without rubs, murmurs or gallops  Edema/varicosities:  Not grossly evident Abdominal  Soft,nontender, without masses, guarding or rebound.  Liver/spleen:  No organomegaly noted  Hernia:  None appreciated  Skin  Inspection:  Grossly normal   Breasts: Examined lying and sitting.   Right: Without masses, retractions, discharge or axillary adenopathy.   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 No retractions, discharge or axillary adenopathy. Genitourinary   Inguinal/mons:  Normal without inguinal adenopathy  External genitalia:  Normal appearing vulva with no masses, tenderness, or  lesions  BUS/Urethra/Skene's glands:  Normal  Vagina:  Normal appearing with normal color and discharge, no lesions  Cervix:  Normal appearing without discharge or lesions  Uterus:  Normal in size, shape and contour.  Midline and mobile, nontender  Adnexa/parametria:     Rt: Normal in size, without masses or tenderness.   Lt: Normal in size, without masses or tenderness.  Anus and perineum: Normal  Digital rectal exam: Not indicated  Patient informed chaperone available to be present for breast and pelvic exam. Patient has requested no chaperone to be present. Patient has been advised what will be completed during breast and pelvic exam.   Assessment/Plan:  [name] for annual exam.   Well female exam with routine gynecological exam - Education provided on SBEs, importance of preventative screenings, current guidelines, high calcium diet, regular exercise, and multivitamin daily. Labs with PCP.   Anxiety with depression - Plan: escitalopram (LEXAPRO) 10 MG tablet daily. Discussed trying to wean and switching to Zoloft for pregnancy. She is worried about switching but is comfortable decreasing to 5 mg.    Screening for cervical cancer -  Normal pap history. Will repeat at 3-year interval per guidelines.   Return in 1 year for annual exam.      Tamela Gammon [facility], 8:30 AM 05/15/2022

## 2022-05-22 ENCOUNTER — Encounter: Payer: Self-pay | Admitting: Nurse Practitioner

## 2022-06-12 ENCOUNTER — Ambulatory Visit: Payer: 59 | Admitting: Nurse Practitioner

## 2022-06-12 ENCOUNTER — Encounter: Payer: Self-pay | Admitting: Nurse Practitioner

## 2022-06-12 VITALS — BP 118/74 | HR 78 | Resp 18 | Ht 64.57 in | Wt 146.4 lb

## 2022-06-12 DIAGNOSIS — Z3201 Encounter for pregnancy test, result positive: Secondary | ICD-10-CM

## 2022-06-12 DIAGNOSIS — N926 Irregular menstruation, unspecified: Secondary | ICD-10-CM | POA: Diagnosis not present

## 2022-06-12 DIAGNOSIS — Z3A01 Less than 8 weeks gestation of pregnancy: Secondary | ICD-10-CM

## 2022-06-12 LAB — PREGNANCY, URINE: Preg Test, Ur: POSITIVE — AB

## 2022-06-12 NOTE — Progress Notes (Signed)
   Acute Office Visit  Subjective:    Patient ID: Gina Shepherd, female    DOB: 12-11-1995, 27 y.o.   MRN: [id number]   HPI 27 y.o. G1P0000 presents today for pregnancy confirmation. + UPT at home 06/08/22. LMP 05/10/2022. Had some brown spotting a couple of weeks later. Having breast tenderness and light cramping. No vaginal bleeding. Taking PNV. Discussed weaning Lexapro or switching to Zoloft during preconception counseling visit last month. She wants to try to wean down to 5 mg, does not want to switch to Zoloft at this time.   Patient's last menstrual period was 04/10/2022 (exact date).    Review of Systems  Constitutional: Negative.   Genitourinary: Negative.        Objective:    Physical Exam Constitutional:      Appearance: Normal appearance.   GU: Not indicated  BP 118/74 (BP Location: Right Arm)   Pulse 78   Resp 18   Ht 5' 4.57" (1.64 m)   Wt 146 lb 6.4 oz (66.4 kg)   LMP 04/10/2022 (Exact Date)   SpO2 94%   BMI 24.69 kg/m  Wt Readings from Last 3 Encounters:  06/12/22 146 lb 6.4 oz (66.4 kg)  05/15/22 147 lb (66.7 kg)  05/09/21 142 lb (64.4 kg)       UPT +  Assessment & Plan:   Problem List Items Addressed This Visit   None Visit Diagnoses     Less than [redacted] weeks gestation of pregnancy    -  Primary   Missed period       Relevant Orders   Pregnancy, urine (Completed)      Plan: + UPT in office today. Has had preconception counseling. Plans to establish with OB. Has not weaned Lexapro and plans to discuss with OB. Red flags discussed. Continue PNV.      Olivia Mackie DNP, 9:36 AM 06/12/2022

## 2022-06-21 ENCOUNTER — Telehealth: Payer: Self-pay | Admitting: *Deleted

## 2022-06-21 NOTE — Telephone Encounter (Signed)
Spoke with patient, advised per Tiffany. Patient verbalizes understanding and is agreeable.  Encounter closed.

## 2022-06-21 NOTE — Telephone Encounter (Signed)
Spoke with patient. In office 06/12/22, positive UPT, is [redacted] weeks pregnant. Noticed brow to red discharge yesterday around lunchtime, has continued to be intermittent. Denies any heavy bleeding or bright red blood. Has experienced light cramping since she become pregnant, no changes. Has not had intercourse over the past few days. Scheduled to see OBGYN on 07/04/22, contacted them first, unable to see her earlier. Denies N/V, fever/chills.   Advised I will review with Tiffany and f/u, patient agreeable.   Tiffany -please review and advise.

## 2022-06-21 NOTE — Telephone Encounter (Signed)
Please reassure her that spotting during pregnancy, especially first trimester can be normal. Does not need to be seen sooner unless she experiences heavy bleeding with or without pain. Continue to monitor.

## 2022-06-28 ENCOUNTER — Encounter: Payer: Self-pay | Admitting: Nurse Practitioner

## 2022-07-04 LAB — OB RESULTS CONSOLE GC/CHLAMYDIA
Chlamydia: NEGATIVE
Neisseria Gonorrhea: NEGATIVE

## 2022-07-27 LAB — OB RESULTS CONSOLE RUBELLA ANTIBODY, IGM: Rubella: NON-IMMUNE/NOT IMMUNE

## 2022-07-27 LAB — HEPATITIS C ANTIBODY: HCV Ab: NEGATIVE

## 2022-07-27 LAB — OB RESULTS CONSOLE RPR: RPR: NONREACTIVE

## 2022-07-27 LAB — OB RESULTS CONSOLE ABO/RH: RH Type: NEGATIVE

## 2022-07-27 LAB — OB RESULTS CONSOLE HEPATITIS B SURFACE ANTIGEN: Hepatitis B Surface Ag: NEGATIVE

## 2022-07-27 LAB — OB RESULTS CONSOLE ANTIBODY SCREEN: Antibody Screen: NEGATIVE

## 2022-07-27 LAB — OB RESULTS CONSOLE HIV ANTIBODY (ROUTINE TESTING): HIV: NONREACTIVE

## 2022-08-17 ENCOUNTER — Ambulatory Visit: Payer: 59

## 2022-08-17 ENCOUNTER — Ambulatory Visit: Payer: 59 | Attending: Obstetrics and Gynecology | Admitting: Obstetrics and Gynecology

## 2022-08-17 ENCOUNTER — Other Ambulatory Visit: Payer: Self-pay | Admitting: *Deleted

## 2022-08-17 DIAGNOSIS — O285 Abnormal chromosomal and genetic finding on antenatal screening of mother: Secondary | ICD-10-CM

## 2022-08-17 DIAGNOSIS — O280 Abnormal hematological finding on antenatal screening of mother: Secondary | ICD-10-CM

## 2022-08-17 NOTE — Progress Notes (Signed)
Charlett Nose Length of Consultation: 40 minutes  Ms. 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  The patient was present at this visit with her partner, Onalee Hua.  Family history and pregnancy history: We obtained a detailed family history and pregnancy history.  This is the first pregnancy for [place] and Onalee Hua.  She reported no complications and no exposure to tobacco, alcohol or recreational drugs.  She is taking Lexapro 5 mg/day.  The family history was reported to be unremarkable for birth defects, intellectual delays, recurrent pregnancy loss or known chromosome abnormalities.  Specifically, there is also no history of features suggestive of DiGeorge syndrome.  Cell-Free DNA Screen High-Risk for Maternal 22q11.2 Deletion Syndrome. Merridee previously completed cell-free DNA screening (cfDNA) in this pregnancy. We reviewed that this screen analyzes cell-free DNA originating from the placenta that is found in the maternal blood circulation during pregnancy. This screen typically provides information regarding the presence or absence of extra fetal DNA for chromosomes 13, 18 and 21 as well as the sex chromosomes. cfDNA can also be used to estimate the risk for a fetal 22q11.2 microdeletion. Occasionally, the cfDNA results indicate a suspected maternal genetic abnormality. In Airica's case, the laboratory reports that there is high risk for a maternal 22q11.2 deletion.   22q11.2 deletion syndrome, sometimes referred to as DiGeorge Syndrome, is caused by the deletion of genes on one end of chromosome 22. Common features of this condition include heart defects, cleft palate, characteristic facial features, immune deficiency, and learning disabilities. Approximately 20% of affected individuals are estimated to have autism spectrum  disorders. Less commonly, affected individuals have autoimmune disease, growth hormone deficiency, hearing loss, and/or psychiatric illness. Symptoms may vary significantly from person to person, even among relatives, and most individuals with the condition live into adulthood. Catalina reports that she is healthy and denies a personal history of the features described above.  We discussed that approximately 90% of individuals with 22q11.2 deletion syndrome have a de novo deletion of the 22q11.2 region that occurred for the first time in them. Approximately 10% of individuals inherit the deletion from one parent who also carries the deletion of the 22q11.2 region. Some individuals are not diagnosed with the condition until they have a child that is more severely affected than they are. 22q11.2 deletion syndrome has autosomal dominant inheritance, meaning once a person has the condition, they have a 50% chance to pass on the chromosome 22 that has the deletion with each pregnancy. Genetic counseling offered Melda maternal testing for 22q11.2 deletion syndrome. Jacqueline accepted this testing. We reviewed that if Judaea is found to be affected with 22q11.2 deletion syndrome, the current pregnancy would have a 50% chance to be affected. We discussed that parents and children with 22q11.2 deletion syndrome can have different features from one another, thus, if Therese is found to be affected with 22q11.2 deletion syndrome we cannot say what features the current pregnancy may have, if at all.  If medicine is found to have a deletion, prenatal diagnosis would be available for this pregnancy.  Carrier screening: Per the ACOG Committee Opinion 691, all women who are considering a pregnancy or are currently pregnant should be offered carrier screening for, at minimum, Cystic Fibrosis (CF), Spinal Muscular Atrophy (SMA), and Hemoglobinopathies The mode of inheritance, clinical manifestations of these conditions, as  well as details  about testing were reviewed. A negative result on carrier screening reduces the likelihood of being a carrier, however, does not entirely rule out the possibility.  Arrin previously had [facility] for carrier screening through [place] performed at her OB/GYN.  This included testing for cystic fibrosis, spinal muscular atrophy, DMD, and Fragile X syndrome.  All of these results were within normal limits; please see the lab report for details and residual risk information.  Hemoglobinopathy evaluation was also performed through fractionation, which showed normal adult hemoglobin (AA).  Plan of Care: Blood was drawn at the time of this visit for a chromosomal microarray on [place].  We will contact her directly when those results become available.  If the patient is found to carry this deletion, prenatal diagnosis would be offered. Would recommend a detailed anatomy ultrasound at [redacted] weeks gestation to evaluate fetal anatomy with particular attention to cleft palate and cardiac defects that can be seen in DiGeorge syndrome.  I have requested that our office reach out to schedule this in approximately 5 weeks. A fetal echocardiogram after [redacted] weeks gestation could also be considered to further evaluate for fetal heart conditions. If Annalea is found to carry this deletion, we would recommend consultation with medical genetics and follow-up for the baby with genetics after delivery.  The patient will be added to the [facility] list for discussion in anticipation of delivery.  Ms. Centrella was encouraged to call with questions or concerns.  We can be contacted at [phone].    Cherly Anderson, MS, CGC

## 2022-08-30 ENCOUNTER — Telehealth: Payer: Self-pay | Admitting: Obstetrics and Gynecology

## 2022-08-30 LAB — CHROMOSOME MICROARRAY

## 2022-08-30 NOTE — Telephone Encounter (Signed)
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 The remainder of the microarray showed no changes.  Haunani elected to have chromosomal microarray testing performed following her genetic counseling visit on August 17, 2022.  She was seen for genetic counseling after the Panorama cell free DNA testing for her pregnancy showed an increased risk for a maternal chromosome 22q11.2 deletion.   22q11.2 deletions are among the most common microdeletions in humans occurring in approximately 1 in 2000 individuals.  Individuals with DiGeorge syndrome, which we talked about at length during her visit, often have deletions of chromosome 22 that are approximately 3 Mb in size. This region of chromosome 22 this typically divided into segments designated A through H.  Most people diagnosed with DiGeorge syndrome have deletions involving A through D with a smaller group of patients having deletions of A and B only. This is referred to as a proximal deletion and is characterized by heart defects, cleft palate, characteristic facial features, immune deficiency, and learning disabilities. Approximately 20% of affected individuals are estimated to have autism spectrum disorders. Less commonly, affected individuals have autoimmune disease, growth hormone deficiency, hearing loss, and/or psychiatric illness. Symptoms may vary significantly from person to person, even among relatives, and most individuals with the condition live into adulthood.   Some individuals have smaller deletions of this region that do not include the genes in the critical region of DiGeorge syndrome. These may be referred to as central or distal deletions.  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 While there is considerable overlap  between this deletion and DiGeorge syndrome, data suggests several differences as well.  Features of the central deletions include approximately 20% chance for structural cardiac abnormalities and 20 to 25% chance for developmental delays, intellectual disabilities or language delays.  Genitourinary anomalies and dysmorphic features as well as growth restriction and psychological or behavioral differences are also known to be increased in individuals with central deletions.  It is not uncommon for individuals with this deletion to be completely healthy with no signs or symptoms of the condition.  It is also important to know that this deletion can be highly variable even within the same family so that some family members may have significant effects from this deletion while others show no symptoms whatsoever.  We cannot predict who may show features or the severity.  Each pregnancy for Mikeshia will have a 50% chance for this deletion.  We offered the following options for screening and testing in this pregnancy: Detailed ultrasound - scheduled on 09/25/22 to evaluate for structural birth defects and fetal growth with particular attention to cardiac and genitourinary tract malformations. Fetal echocardiogram - after [redacted] weeks gestation through pediatric cardiology can further assess for structural heart defects. Amniocentesis - Areion declined prenatal diagnosis for 22q deletions through amniocentesis.  We reviewed the procedure, risks, benefits and limitations of this testing and she would prefer to wait until after birth for testing. Postnatal genetics evaluation- we will add Drucella to the [facility] list to ensure the pediatric and neonatal teams are aware of her results and the 50% risk for the baby to have this central deletion.  Genetics evaluation and testing as desired can be available after birth.  We can be reached at [phone] with any questions or concerns.  Cherly Anderson,  MS, CGC

## 2022-09-21 ENCOUNTER — Encounter: Payer: Self-pay | Admitting: *Deleted

## 2022-09-21 DIAGNOSIS — O3519X0 Maternal care for (suspected) chromosomal abnormality in fetus, other chromosomal abnormality, not applicable or unspecified: Secondary | ICD-10-CM | POA: Insufficient documentation

## 2022-09-25 ENCOUNTER — Ambulatory Visit: Payer: 59 | Admitting: *Deleted

## 2022-09-25 ENCOUNTER — Ambulatory Visit: Payer: 59 | Attending: Obstetrics and Gynecology

## 2022-09-25 DIAGNOSIS — O3503X0 Maternal care for (suspected) central nervous system malformation or damage in fetus, choroid plexus cysts, not applicable or unspecified: Secondary | ICD-10-CM

## 2022-09-25 DIAGNOSIS — O280 Abnormal hematological finding on antenatal screening of mother: Secondary | ICD-10-CM | POA: Diagnosis present

## 2022-09-25 DIAGNOSIS — O285 Abnormal chromosomal and genetic finding on antenatal screening of mother: Secondary | ICD-10-CM | POA: Diagnosis not present

## 2022-09-25 DIAGNOSIS — Z3A19 19 weeks gestation of pregnancy: Secondary | ICD-10-CM | POA: Diagnosis not present

## 2022-09-26 ENCOUNTER — Other Ambulatory Visit: Payer: Self-pay | Admitting: *Deleted

## 2022-09-26 DIAGNOSIS — Z362 Encounter for other antenatal screening follow-up: Secondary | ICD-10-CM

## 2022-09-26 DIAGNOSIS — O3519X0 Maternal care for (suspected) chromosomal abnormality in fetus, other chromosomal abnormality, not applicable or unspecified: Secondary | ICD-10-CM

## 2022-11-13 ENCOUNTER — Encounter: Payer: Self-pay | Admitting: Obstetrics and Gynecology

## 2022-11-20 ENCOUNTER — Ambulatory Visit: Payer: 59

## 2022-11-23 ENCOUNTER — Ambulatory Visit: Payer: 59

## 2022-12-11 ENCOUNTER — Inpatient Hospital Stay (HOSPITAL_COMMUNITY): Payer: 59

## 2022-12-11 ENCOUNTER — Inpatient Hospital Stay (HOSPITAL_COMMUNITY)
Admission: AD | Admit: 2022-12-11 | Discharge: 2022-12-11 | Disposition: A | Discharge status: Home / Self Care | Payer: 59 | Attending: Obstetrics and Gynecology | Admitting: Obstetrics and Gynecology

## 2022-12-11 ENCOUNTER — Encounter (HOSPITAL_COMMUNITY): Payer: Self-pay | Admitting: Obstetrics and Gynecology

## 2022-12-11 DIAGNOSIS — Z3A30 30 weeks gestation of pregnancy: Secondary | ICD-10-CM | POA: Diagnosis not present

## 2022-12-11 DIAGNOSIS — O3519X0 Maternal care for (suspected) chromosomal abnormality in fetus, other chromosomal abnormality, not applicable or unspecified: Secondary | ICD-10-CM

## 2022-12-11 DIAGNOSIS — O368131 Decreased fetal movements, third trimester, fetus 1: Secondary | ICD-10-CM | POA: Diagnosis not present

## 2022-12-11 DIAGNOSIS — O368130 Decreased fetal movements, third trimester, not applicable or unspecified: Secondary | ICD-10-CM

## 2022-12-11 NOTE — MAU Note (Signed)
.  EDLA PARA is a 27 y.o. at [redacted]w[redacted]d here in MAU reporting: decreased fetal movement x 2 days, more more decreased today. Denies bleeding or ROM. Denies pain Onset of complaint: 2 days Pain score: 0/10 Vitals:   12/11/22 2117  BP: 127/66  Pulse: 88  Resp: 17  Temp: 98.4 F (36.9 C)  SpO2: 99%     FHT:155 Lab orders placed from triage:

## 2022-12-11 NOTE — MAU Provider Note (Signed)
No chief complaint on file.   S: Gina Shepherd  is a 27 y.o. y.o. year old G28P0000 female at [redacted]w[redacted]d weeks gestation who presents to MAU reporting decreased fetal movement x 2 days.  No improvement with p.o. hydration, eating and position changes. 22q11.2 deletion.    Contractions: Denies Vaginal bleeding: Denies Leaking of fluid: Denies  Patient Active Problem List   Diagnosis Date Noted   22q11.2 deletion, fetal, affecting maternal care 09/21/2022    O: Patient Vitals for the past 24 hrs:  BP Temp Pulse Resp SpO2 Height Weight  12/11/22 2324 118/68 -- 73 -- -- -- --  12/11/22 2117 127/66 98.4 F (36.9 C) 88 17 99 % 5\' 5"  (1.651 m) 78.9 kg   General: NAD Heart: Regular rate Lungs: Normal rate and effort Abd: Soft, NT, Gravid, S=D Pelvic: NEFG, no blood.     NST performed EFM: 145, mod variability, 15x15 accels, no decels Toco: Uterine irritability  MAU course/MDM BPP 10/10. Pt reports feeling fetal mvmt in MAU.  A: [redacted]w[redacted]d week IUP Fetal heart rate reactive Decreased fetal movement resolved with reassuring fetal testing. 1. Decreased fetal movement, third trimester, fetus 1   2. 22q11.2 deletion, fetal, affecting maternal care   3. [redacted] weeks gestation of pregnancy      P: Discharge home in stable condition. Preterm labor precautions and fetal kick counts. Follow-up as scheduled for prenatal visit or sooner as needed if symptoms worsen. Return to maternity admissions as needed if symptoms worsen.  Katrinka Blazing, [place], CNM 12/11/2022 11:44 PM  2

## 2023-01-19 LAB — OB RESULTS CONSOLE GBS: GBS: NEGATIVE

## 2023-01-24 ENCOUNTER — Emergency Department: Payer: 59

## 2023-01-24 ENCOUNTER — Emergency Department
Admission: EM | Admit: 2023-01-24 | Discharge: 2023-01-25 | Disposition: A | Discharge status: Home / Self Care | Payer: 59 | Attending: Emergency Medicine | Admitting: Emergency Medicine

## 2023-01-24 ENCOUNTER — Other Ambulatory Visit: Payer: Self-pay

## 2023-01-24 DIAGNOSIS — X501XXA Overexertion from prolonged static or awkward postures, initial encounter: Secondary | ICD-10-CM | POA: Diagnosis not present

## 2023-01-24 DIAGNOSIS — O9A213 Injury, poisoning and certain other consequences of external causes complicating pregnancy, third trimester: Secondary | ICD-10-CM | POA: Diagnosis present

## 2023-01-24 DIAGNOSIS — O26893 Other specified pregnancy related conditions, third trimester: Secondary | ICD-10-CM | POA: Insufficient documentation

## 2023-01-24 DIAGNOSIS — Z3A37 37 weeks gestation of pregnancy: Secondary | ICD-10-CM | POA: Insufficient documentation

## 2023-01-24 DIAGNOSIS — S93492A Sprain of other ligament of left ankle, initial encounter: Secondary | ICD-10-CM | POA: Diagnosis not present

## 2023-01-24 MED ORDER — ACETAMINOPHEN 500 MG PO TABS
1000.0000 mg | ORAL_TABLET | Freq: Once | ORAL | Status: AC
  Administered 2023-01-24: 1000 mg via ORAL
  Filled 2023-01-24: qty 2

## 2023-01-24 NOTE — Discharge Instructions (Signed)
Use Tylenol for pain and fevers.  Up to 1000 mg per dose, up to 4 times per day.  Do not take more than 4000 mg of Tylenol/acetaminophen within 24 hours.Marland Kitchen

## 2023-01-24 NOTE — ED Provider Notes (Signed)
[facility] Provider Note    Event Date/Time   First MD Initiated Contact with Patient 01/24/23 2332     (approximate)   History   Fall and Ankle Pain   HPI  Gina Shepherd is a 27 y.o. female who presents to the ED for evaluation of Fall and Ankle Pain   Patient is about [redacted] weeks gestation and twisted her ankle on some steps.  No abdominal pain or obstetric complaints.  Twisted her left ankle.  Physical Exam   Triage Vital Signs: ED Triage Vitals  Encounter Vitals Group     BP 01/24/23 2238 (!) 139/102     Systolic BP Percentile --      Diastolic BP Percentile --      Pulse Rate 01/24/23 2238 100     Resp 01/24/23 2238 18     Temp 01/24/23 2238 98.4 F (36.9 C)     Temp Source 01/24/23 2238 Oral     SpO2 01/24/23 2238 96 %     Weight 01/24/23 2237 182 lb (82.6 kg)     Height 01/24/23 2237 5\' 5"  (1.651 m)     Head Circumference --      Peak Flow --      Pain Score 01/24/23 2237 10     Pain Loc --      Pain Education --      Exclude from Growth Chart --     Most recent vital signs: Vitals:   01/24/23 2238  BP: (!) 139/102  Pulse: 100  Resp: 18  Temp: 98.4 F (36.9 C)  SpO2: 96%    General: Awake, no distress.  CV:  Good peripheral perfusion.  Resp:  Normal effort.  Abd:  No distention.  MSK:  No deformity noted.  Close swelling laterally over the left ankle.  No point bony tenderness Neuro:  No focal deficits appreciated. Other:     ED Results / Procedures / Treatments   Labs (all labs ordered are listed, but only abnormal results are displayed) Labs Reviewed - No data to display  EKG   RADIOLOGY Plain film the left ankle interpreted by me without evidence of fracture or dislocation  Official radiology report(s): DG Ankle Complete Left  Result Date: 01/24/2023 CLINICAL DATA:  Left ankle pain and swelling that started after falling EXAM: LEFT ANKLE COMPLETE - 3+ VIEW COMPARISON:  09/26/2017 FINDINGS: Soft tissue  swelling about the left ankle. No acute fracture or dislocation. IMPRESSION: Soft tissue swelling without acute fracture. Electronically Signed   By: Minerva Fester M.D.   On: 01/24/2023 23:34    PROCEDURES and INTERVENTIONS:  Procedures  Medications  acetaminophen (TYLENOL) tablet 1,000 mg (1,000 mg Oral Given 01/24/23 2258)     IMPRESSION / MDM / ASSESSMENT AND PLAN / ED COURSE  I reviewed the triage vital signs and the nursing notes.  Differential diagnosis includes, but is not limited to, sprain, strain, fracture, dislocation  {Patient presents with symptoms of an acute illness or injury that is potentially life-threatening.  Patient presents with a sprained left ankle suitable for outpatient management with immobilization.  Gravid without obstetric complaints acutely.  Suitable for outpatient management.  Discussed DVT risk, expectant management and podiatry follow-up      FINAL CLINICAL IMPRESSION(S) / ED DIAGNOSES   Final diagnoses:  Sprain of posterior talofibular ligament of left ankle, initial encounter     Rx / DC Orders   ED Discharge Orders     None  Note:  This document was prepared using Dragon voice recognition software and may include unintentional dictation errors.   Delton [place], MD 01/24/23 [phone]

## 2023-01-24 NOTE — ED Triage Notes (Signed)
Pt reports she missed 1 step and rolled her L ankle. Swelling noted. Pt reports concern for pregnancy as she is 37.[redacted] wks pregnant. Denies hitting abd or her person falling/rolling down the stairs. Pt doesn't think she is experiencing vaginal bleeding. Denies abd pain. Pt alert and oriented. Breathing unlabored speaking in full sentences.

## 2023-01-25 NOTE — ED Notes (Signed)
Pt left without discharge instructions.

## 2023-01-25 NOTE — ED Notes (Signed)
Walker provided, not crutches

## 2023-02-12 ENCOUNTER — Encounter (HOSPITAL_COMMUNITY): Payer: Self-pay | Admitting: Obstetrics and Gynecology

## 2023-02-12 ENCOUNTER — Inpatient Hospital Stay (HOSPITAL_COMMUNITY): Payer: 59 | Admitting: Anesthesiology

## 2023-02-12 ENCOUNTER — Other Ambulatory Visit: Payer: Self-pay

## 2023-02-12 ENCOUNTER — Inpatient Hospital Stay (HOSPITAL_COMMUNITY)
Admission: AD | Admit: 2023-02-12 | Discharge: 2023-02-15 | DRG: 768 | Disposition: A | Discharge status: Home / Self Care | Payer: 59 | Attending: Obstetrics and Gynecology | Admitting: Obstetrics and Gynecology

## 2023-02-12 DIAGNOSIS — O99344 Other mental disorders complicating childbirth: Secondary | ICD-10-CM | POA: Diagnosis present

## 2023-02-12 DIAGNOSIS — Z8249 Family history of ischemic heart disease and other diseases of the circulatory system: Secondary | ICD-10-CM

## 2023-02-12 DIAGNOSIS — F32A Depression, unspecified: Secondary | ICD-10-CM | POA: Diagnosis present

## 2023-02-12 DIAGNOSIS — Z6791 Unspecified blood type, Rh negative: Secondary | ICD-10-CM

## 2023-02-12 DIAGNOSIS — O26893 Other specified pregnancy related conditions, third trimester: Secondary | ICD-10-CM | POA: Diagnosis present

## 2023-02-12 DIAGNOSIS — R030 Elevated blood-pressure reading, without diagnosis of hypertension: Secondary | ICD-10-CM | POA: Diagnosis present

## 2023-02-12 DIAGNOSIS — Z3A39 39 weeks gestation of pregnancy: Secondary | ICD-10-CM | POA: Diagnosis not present

## 2023-02-12 DIAGNOSIS — Z87891 Personal history of nicotine dependence: Secondary | ICD-10-CM | POA: Diagnosis not present

## 2023-02-12 DIAGNOSIS — Z818 Family history of other mental and behavioral disorders: Secondary | ICD-10-CM | POA: Diagnosis not present

## 2023-02-12 DIAGNOSIS — Z1152 Encounter for screening for COVID-19: Secondary | ICD-10-CM | POA: Diagnosis not present

## 2023-02-12 DIAGNOSIS — Z9189 Other specified personal risk factors, not elsewhere classified: Secondary | ICD-10-CM | POA: Diagnosis not present

## 2023-02-12 DIAGNOSIS — Q541 Hypospadias, penile: Secondary | ICD-10-CM | POA: Diagnosis not present

## 2023-02-12 DIAGNOSIS — O479 False labor, unspecified: Principal | ICD-10-CM | POA: Diagnosis present

## 2023-02-12 HISTORY — DX: Anxiety disorder, unspecified: F41.9

## 2023-02-12 HISTORY — DX: Depression, unspecified: F32.A

## 2023-02-12 LAB — COMPREHENSIVE METABOLIC PANEL
ALT: 22 U/L (ref 0–44)
AST: 40 U/L (ref 15–41)
Albumin: 3.1 g/dL — ABNORMAL LOW (ref 3.5–5.0)
Alkaline Phosphatase: 184 U/L — ABNORMAL HIGH (ref 38–126)
Anion gap: 10 (ref 5–15)
BUN: 7 mg/dL (ref 6–20)
CO2: 20 mmol/L — ABNORMAL LOW (ref 22–32)
Calcium: 9 mg/dL (ref 8.9–10.3)
Chloride: 107 mmol/L (ref 98–111)
Creatinine, Ser: 0.6 mg/dL (ref 0.44–1.00)
GFR, Estimated: >60 mL/min (ref >60)
Glucose, Bld: 93 mg/dL (ref 70–99)
Potassium: 3.7 mmol/L (ref 3.5–5.1)
Sodium: 137 mmol/L (ref 135–145)
Total Bilirubin: 0.6 mg/dL (ref <1.2)
Total Protein: 6.5 g/dL (ref 6.5–8.1)

## 2023-02-12 LAB — RESP PANEL BY RT-PCR (RSV, FLU A&B, COVID)  RVPGX2
Influenza A by PCR: POSITIVE — AB
Influenza B by PCR: NEGATIVE
Resp Syncytial Virus by PCR: NEGATIVE
SARS Coronavirus 2 by RT PCR: NEGATIVE

## 2023-02-12 LAB — CBC
HCT: 38.4 % (ref 36.0–46.0)
Hemoglobin: 13 g/dL (ref 12.0–15.0)
MCH: 28.6 pg (ref 26.0–34.0)
MCHC: 33.9 g/dL (ref 30.0–36.0)
MCV: 84.4 fL (ref 80.0–100.0)
Platelets: 239 10*3/uL (ref 150–400)
RBC: 4.55 MIL/uL (ref 3.87–5.11)
RDW: 13.2 % (ref 11.5–15.5)
WBC: 8 10*3/uL (ref 4.0–10.5)
nRBC: 0 % (ref 0.0–0.2)

## 2023-02-12 LAB — PROTEIN / CREATININE RATIO, URINE
Creatinine, Urine: 50 mg/dL
Protein Creatinine Ratio: 0.24 mg/mg{creat} — ABNORMAL HIGH (ref 0.00–0.15)
Total Protein, Urine: 12 mg/dL

## 2023-02-12 MED ORDER — PHENYLEPHRINE 80 MCG/ML (10ML) SYRINGE FOR IV PUSH (FOR BLOOD PRESSURE SUPPORT)
80.0000 ug | PREFILLED_SYRINGE | INTRAVENOUS | Status: DC | PRN
Start: 2023-02-12 — End: 2023-02-13

## 2023-02-12 MED ORDER — OXYTOCIN BOLUS FROM INFUSION
333.0000 mL | Freq: Once | INTRAVENOUS | Status: AC
  Administered 2023-02-13: 333 mL via INTRAVENOUS

## 2023-02-12 MED ORDER — OXYTOCIN-SODIUM CHLORIDE 30-0.9 UT/500ML-% IV SOLN
2.5000 [IU]/h | INTRAVENOUS | Status: DC
  Administered 2023-02-13: 2.5 [IU]/h via INTRAVENOUS
  Filled 2023-02-12: qty 500

## 2023-02-12 MED ORDER — ACETAMINOPHEN 325 MG PO TABS
650.0000 mg | ORAL_TABLET | ORAL | Status: DC | PRN
  Administered 2023-02-13 (×2): 650 mg via ORAL
  Filled 2023-02-12 (×2): qty 2

## 2023-02-12 MED ORDER — EPHEDRINE 5 MG/ML INJ: 10.0000 mg | INTRAVENOUS | Status: DC | PRN

## 2023-02-12 MED ORDER — ONDANSETRON HCL 4 MG/2ML IJ SOLN
4.0000 mg | Freq: Four times a day (QID) | INTRAMUSCULAR | Status: DC | PRN
  Administered 2023-02-13: 4 mg via INTRAVENOUS
  Filled 2023-02-12: qty 2

## 2023-02-12 MED ORDER — LIDOCAINE HCL (PF) 1 % IJ SOLN: 30.0000 mL | INTRAMUSCULAR | Status: DC | PRN

## 2023-02-12 MED ORDER — SOD CITRATE-CITRIC ACID 500-334 MG/5ML PO SOLN: 30.0000 mL | ORAL | Status: DC | PRN

## 2023-02-12 MED ORDER — OXYCODONE-ACETAMINOPHEN 5-325 MG PO TABS: 2.0000 | ORAL_TABLET | ORAL | Status: DC | PRN

## 2023-02-12 MED ORDER — BUPIVACAINE HCL (PF) 0.25 % IJ SOLN
INTRAMUSCULAR | Status: DC | PRN
  Administered 2023-02-12 (×2): 5 mL via EPIDURAL

## 2023-02-12 MED ORDER — LACTATED RINGERS IV SOLN: INTRAVENOUS | Status: DC

## 2023-02-12 MED ORDER — OXYCODONE-ACETAMINOPHEN 5-325 MG PO TABS: 1.0000 | ORAL_TABLET | ORAL | Status: DC | PRN

## 2023-02-12 MED ORDER — FENTANYL-BUPIVACAINE-NACL 0.5-0.125-0.9 MG/250ML-% EP SOLN
EPIDURAL | Status: AC
  Filled 2023-02-12: qty 250

## 2023-02-12 MED ORDER — LACTATED RINGERS IV SOLN: 500.0000 mL | INTRAVENOUS | Status: DC | PRN

## 2023-02-12 MED ORDER — LIDOCAINE-EPINEPHRINE (PF) 2 %-1:200000 IJ SOLN
INTRAMUSCULAR | Status: DC | PRN
  Administered 2023-02-12: 5 mL via EPIDURAL

## 2023-02-12 MED ORDER — FENTANYL CITRATE (PF) 100 MCG/2ML IJ SOLN: 50.0000 ug | INTRAMUSCULAR | Status: DC | PRN

## 2023-02-12 MED ORDER — FENTANYL-BUPIVACAINE-NACL 0.5-0.125-0.9 MG/250ML-% EP SOLN
12.0000 mL/h | EPIDURAL | Status: DC | PRN
  Administered 2023-02-12: 12 mL/h via EPIDURAL

## 2023-02-12 MED ORDER — PHENYLEPHRINE 80 MCG/ML (10ML) SYRINGE FOR IV PUSH (FOR BLOOD PRESSURE SUPPORT): 80.0000 ug | PREFILLED_SYRINGE | INTRAVENOUS | Status: DC | PRN

## 2023-02-12 MED ORDER — DIPHENHYDRAMINE HCL 50 MG/ML IJ SOLN
12.5000 mg | INTRAMUSCULAR | Status: DC | PRN
  Administered 2023-02-13: 12.5 mg via INTRAVENOUS
  Filled 2023-02-12: qty 1

## 2023-02-12 MED ORDER — LACTATED RINGERS IV SOLN: 500.0000 mL | Freq: Once | INTRAVENOUS | Status: DC

## 2023-02-12 NOTE — Progress Notes (Signed)
OB Progress Note  S: comfortable with epidural   O: Today's Vitals   02/12/23 2031 02/12/23 2032 02/12/23 2036 02/12/23 2043  BP:  127/70 127/72   Pulse:  (!) 101 (!) 101   Resp:      Temp:    98.7 F (37.1 C)  TempSrc:    Oral  SpO2: 97%  98%   Weight:      Height:      PainSc:       Body mass index is 31.6 kg/m.  SVE 5/80/-1 AROM blood tinged clear fluid. + Scalp stim  FHR: 140bpm, mod variability, + accels, occasional variable decels Toco:  ctx q 3-4 mins   Latest Reference Range & Units 02/12/23 19:20  Influenza A By PCR NEGATIVE  POSITIVE !  Influenza B By PCR NEGATIVE  NEGATIVE  Respiratory Syncytial Virus by PCR NEGATIVE  NEGATIVE  SARS Coronavirus 2 by RT PCR NEGATIVE  NEGATIVE  !: Data is abnormal  A/P: 27Y G1P0 @ [redacted]w[redacted]d, labor Fetal wellbeing: cat I tracing Labor: s/p AROM, expectant management, anticipate SVD Pain control: epidural in place Influenza A: droplet/contact precautions  Few elevated blood pressures in MAU: likely pain related, admission labs normal. Urine P/C 0.24. Will monitor.    Charlett Nose 02/12/2023, 10:26 PM

## 2023-02-12 NOTE — H&P (Signed)
Gina Shepherd is a 27 y.o. female presenting for contractions that started over the weekend and became more painful today. Had some bloody show yesterday. No LOF. Good Fetal movement.   In MAU, initial exam 2.5/80/-1, progressed to 4/80/-1 in about 1.5 hours. Requested epidural.   Of note, husband sick with URI symptoms and fever at home. Patient has some cough but no fever.   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 50% chance baby will have same deletion. Declined amniocentesis and echocardiogram. Growth [place] at 32 weeks: EFW 39% (4lb1oz) Depressive disorder: stable on lexapro 5mg  RhD negative: Rhogam given at 28 weeks   OB History     Gravida  1   Para  0   Term  0   Preterm  0   AB  0   Living  0      SAB  0   IAB  0   Ectopic  0   Multiple  0   Live Births             Past Medical History:  Diagnosis Date   Acne 06/2016   completed accutane treatment in 02/2017.   Anxiety    Depression    Dysmenorrhea 04/13/2015   Heartburn 03/16/2020   Other allergic rhinitis 03/16/2020   Past Surgical History:  Procedure Laterality Date   BREAST BIOPSY Left    Benign   WISDOM TOOTH EXTRACTION     Family History: family history includes Endometriosis in an other family member; Heart attack in her maternal grandfather; High Cholesterol in her maternal grandmother. Social History:  reports that she has quit smoking. She has never used smokeless tobacco. She reports that she does not currently use alcohol after a past usage of about 2.0 standard drinks of alcohol per week. She reports that she does not use drugs.     Maternal Diabetes: No Genetic Screening: Abnormal:  Results: Other: 22q11.2 carrier Maternal Ultrasounds/Referrals: Normal Fetal Ultrasounds or other Referrals:  Referred to Materal Fetal Medicine  Maternal Substance Abuse:  No Significant Maternal Medications:  Meds include: Other:  Lexapro Significant Maternal Lab Results:  Group B Strep negative and Rh negative Number of Prenatal Visits:greater than 3 verified prenatal visits Maternal Vaccinations:RSV: Given during pregnancy >/=14 days ago, TDap, and Flu Other Comments:  None  Review of Systems History Dilation: 4 Effacement (%): 80 Station: -1 Exam by:: Boone Master, RN Blood pressure 128/79, pulse (!) 105, temperature 98.5 F (36.9 C), temperature source Oral, resp. rate 19, height 5\' 5"  (1.651 m), weight 86.1 kg, last menstrual period 04/10/2022, SpO2 95%. Exam Physical Exam  Gen: well appearing CVS: normal pulses Lungs: nonlabored respirations Abd: gravid, Leop[place] 6.5lbs Ext: no calf edema or tenderness  NST: 150bpm, mod variability, + accels, no decels Toco: ctx q 3-4 mins  CBC    Component Value Date/Time   WBC 8.0 02/12/2023 1828   RBC 4.55 02/12/2023 1828   HGB 13.0 02/12/2023 1828   HCT 38.4 02/12/2023 1828   PLT 239 02/12/2023 1828   MCV 84.4 02/12/2023 1828   MCH 28.6 02/12/2023 1828   MCHC 33.9 02/12/2023 1828   RDW 13.2 02/12/2023 1828    CMP     Component Value Date/Time   NA 137 02/12/2023 1828   K 3.7 02/12/2023 1828   CL 107 02/12/2023 1828   CO2 20 (L) 02/12/2023 1828   GLUCOSE 93 02/12/2023 1828   BUN 7 02/12/2023 1828  CREATININE 0.60 02/12/2023 1828   CALCIUM 9.0 02/12/2023 1828   PROT 6.5 02/12/2023 1828   ALBUMIN 3.1 (L) 02/12/2023 1828   AST 40 02/12/2023 1828   ALT 22 02/12/2023 1828   ALKPHOS 184 (H) 02/12/2023 1828   BILITOT 0.6 02/12/2023 1828   GFRNONAA >60 02/12/2023 1828    Latest Reference Range & Units 02/12/23 18:56  Protein Creatinine Ratio 0.00 - 0.15 mg/mgCre 0.24 (H)  (H): Data is abnormally high   Prenatal labs: ABO, Rh: --/--/B NEG (12/16 1828) Antibody: POS (12/16 1828) Rubella: Nonimmune (05/30 0000) RPR: Nonreactive (05/30 0000)  HBsAg: Negative (05/30 0000)  HIV: Non-reactive (05/30 0000)  GBS: Negative/-- (11/22 0000)    Assessment/Plan: 27Y G1P0 @ [redacted]w[redacted]d, labor Fetal wellbeing: cat I tracing Labor: progressing spontaneously, expectant management. Plan recheck at 2200.  Pain control: epidural in place URI symptoms: rapid flu/covid ordered  Few elevated blood pressures in MAU: likely pain related, admission labs normal. Urine P/C 0.24. Will monitor.   Charlett Nose 02/12/2023, 8:36 PM

## 2023-02-12 NOTE — Anesthesia Procedure Notes (Signed)
Epidural Patient location during procedure: OB Start time: 02/12/2023 8:01 PM End time: 02/12/2023 8:09 PM  Staffing Anesthesiologist: Mariann Barter, MD Performed: anesthesiologist   Preanesthetic Checklist Completed: patient identified, IV checked, site marked, risks and benefits discussed, surgical consent, monitors and equipment checked, pre-op evaluation and timeout performed  Epidural Patient position: sitting Prep: DuraPrep Patient monitoring: heart rate, continuous pulse ox and blood pressure Approach: midline Location: L4-L5 Injection technique: LOR saline  Needle:  Needle type: Tuohy  Needle gauge: 17 G Needle length: 9 cm Needle insertion depth: 5 cm Catheter type: closed end flexible Catheter size: 19 Gauge Catheter at skin depth: 10 cm Test dose: negative and 1.5% lidocaine with Epi 1:200 K  Assessment Events: blood not aspirated, no cerebrospinal fluid, injection not painful, no injection resistance, no paresthesia and negative IV test  Additional Notes Reason for block:procedure for pain

## 2023-02-12 NOTE — MAU Note (Signed)
.  Gina Shepherd is a 27 y.o. at [redacted]w[redacted]d here in MAU reporting: CTX's since yesterday that are every 8-9 minutes apart. Denies VB or LOF. +FM.  Onset of complaint: Yesterday Pain score: 8/10 lower abdomen  Vitals:   02/12/23 1621  BP: (!) 140/77  Pulse: (!) 110  Resp: 16  Temp: 98.8 F (37.1 C)  SpO2: 100%     FHT: 155 initial external Lab orders placed from triage:  MAU Labor Eval

## 2023-02-12 NOTE — Anesthesia Preprocedure Evaluation (Signed)
Anesthesia Evaluation  Patient identified by MRN, date of birth, ID band Patient awake    Reviewed: Allergy & Precautions, NPO status , Patient's Chart, lab work & pertinent test results, reviewed documented beta blocker date and time   History of Anesthesia Complications Negative for: history of anesthetic complications  Airway Mallampati: III  TM Distance: >3 FB     Dental no notable dental hx.    Pulmonary neg COPD, former smoker, neg PE   breath sounds clear to auscultation       Cardiovascular (-) hypertension(-) angina (-) CAD and (-) Past MI  Rhythm:Regular Rate:Normal     Neuro/Psych neg Seizures PSYCHIATRIC DISORDERS Anxiety Depression       GI/Hepatic ,neg GERD  ,,(+) neg Cirrhosis        Endo/Other    Renal/GU Renal disease     Musculoskeletal   Abdominal   Peds  Hematology   Anesthesia Other Findings   Reproductive/Obstetrics                             Anesthesia Physical Anesthesia Plan  ASA: 2  Anesthesia Plan: Epidural   Post-op Pain Management:    Induction:   PONV Risk Score and Plan:   Airway Management Planned:   Additional Equipment:   Intra-op Plan:   Post-operative Plan:   Informed Consent: I have reviewed the patients History and Physical, chart, labs and discussed the procedure including the risks, benefits and alternatives for the proposed anesthesia with the patient or authorized representative who has indicated his/her understanding and acceptance.       Plan Discussed with:   Anesthesia Plan Comments:        Anesthesia Quick Evaluation

## 2023-02-13 LAB — TYPE AND SCREEN
ABO/RH(D): B NEG
Antibody Screen: POSITIVE

## 2023-02-13 LAB — RPR: RPR Ser Ql: NONREACTIVE

## 2023-02-13 MED ORDER — WITCH HAZEL-GLYCERIN EX PADS: 1.0000 | MEDICATED_PAD | CUTANEOUS | Status: DC | PRN

## 2023-02-13 MED ORDER — SIMETHICONE 80 MG PO CHEW: 80.0000 mg | CHEWABLE_TABLET | ORAL | Status: DC | PRN

## 2023-02-13 MED ORDER — METHYLERGONOVINE MALEATE 0.2 MG PO TABS: 0.2000 mg | ORAL_TABLET | ORAL | Status: DC | PRN

## 2023-02-13 MED ORDER — BENZOCAINE-MENTHOL 20-0.5 % EX AERO
1.0000 | INHALATION_SPRAY | CUTANEOUS | Status: DC | PRN
  Administered 2023-02-13: 1 via TOPICAL
  Filled 2023-02-13: qty 56

## 2023-02-13 MED ORDER — TETANUS-DIPHTH-ACELL PERTUSSIS 5-2.5-18.5 LF-MCG/0.5 IM SUSY: 0.5000 mL | PREFILLED_SYRINGE | Freq: Once | INTRAMUSCULAR | Status: DC

## 2023-02-13 MED ORDER — IBUPROFEN 600 MG PO TABS
600.0000 mg | ORAL_TABLET | Freq: Four times a day (QID) | ORAL | Status: DC
  Administered 2023-02-13 – 2023-02-15 (×8): 600 mg via ORAL
  Filled 2023-02-13 (×8): qty 1

## 2023-02-13 MED ORDER — METHYLERGONOVINE MALEATE 0.2 MG/ML IJ SOLN: 0.2000 mg | INTRAMUSCULAR | Status: DC | PRN

## 2023-02-13 MED ORDER — ACETAMINOPHEN 325 MG PO TABS
650.0000 mg | ORAL_TABLET | ORAL | Status: DC | PRN
Start: 2023-02-13 — End: 2023-02-15
  Administered 2023-02-13 – 2023-02-14 (×2): 650 mg via ORAL
  Filled 2023-02-13 (×2): qty 2

## 2023-02-13 MED ORDER — DIBUCAINE (PERIANAL) 1 % EX OINT: 1.0000 | TOPICAL_OINTMENT | CUTANEOUS | Status: DC | PRN

## 2023-02-13 MED ORDER — ONDANSETRON HCL 4 MG/2ML IJ SOLN
4.0000 mg | INTRAMUSCULAR | Status: DC | PRN
Start: 2023-02-13 — End: 2023-02-15

## 2023-02-13 MED ORDER — ONDANSETRON HCL 4 MG PO TABS: 4.0000 mg | ORAL_TABLET | ORAL | Status: DC | PRN

## 2023-02-13 MED ORDER — PRENATAL MULTIVITAMIN CH
1.0000 | ORAL_TABLET | Freq: Every day | ORAL | Status: DC
  Administered 2023-02-15: 1 via ORAL
  Filled 2023-02-13 (×2): qty 1

## 2023-02-13 MED ORDER — OSELTAMIVIR PHOSPHATE 75 MG PO CAPS
75.0000 mg | ORAL_CAPSULE | Freq: Two times a day (BID) | ORAL | Status: DC
  Administered 2023-02-13: 75 mg via ORAL
  Filled 2023-02-13 (×4): qty 1

## 2023-02-13 MED ORDER — COCONUT OIL OIL: 1.0000 | TOPICAL_OIL | Status: DC | PRN

## 2023-02-13 MED ORDER — OXYTOCIN-SODIUM CHLORIDE 30-0.9 UT/500ML-% IV SOLN: 2.5000 [IU]/h | INTRAVENOUS | Status: DC | PRN

## 2023-02-13 MED ORDER — OXYCODONE HCL 5 MG PO TABS: 5.0000 mg | ORAL_TABLET | ORAL | Status: DC | PRN

## 2023-02-13 MED ORDER — ZOLPIDEM TARTRATE 5 MG PO TABS: 5.0000 mg | ORAL_TABLET | Freq: Every evening | ORAL | Status: DC | PRN

## 2023-02-13 MED ORDER — SENNOSIDES-DOCUSATE SODIUM 8.6-50 MG PO TABS
2.0000 | ORAL_TABLET | Freq: Every day | ORAL | Status: DC
  Administered 2023-02-14 – 2023-02-15 (×2): 2 via ORAL
  Filled 2023-02-13 (×2): qty 2

## 2023-02-13 MED ORDER — OSELTAMIVIR PHOSPHATE 75 MG PO CAPS
75.0000 mg | ORAL_CAPSULE | Freq: Two times a day (BID) | ORAL | Status: DC
  Administered 2023-02-13 – 2023-02-15 (×4): 75 mg via ORAL
  Filled 2023-02-13 (×5): qty 1

## 2023-02-13 MED ORDER — ESCITALOPRAM OXALATE 10 MG PO TABS: 10.0000 mg | ORAL_TABLET | Freq: Every day | ORAL | Status: DC

## 2023-02-13 MED ORDER — OXYCODONE HCL 5 MG PO TABS: 10.0000 mg | ORAL_TABLET | ORAL | Status: DC | PRN

## 2023-02-13 MED ORDER — LACTATED RINGERS IV SOLN: INTRAVENOUS | Status: AC

## 2023-02-13 MED ORDER — ESCITALOPRAM OXALATE 10 MG PO TABS
5.0000 mg | ORAL_TABLET | Freq: Every day | ORAL | Status: DC
  Administered 2023-02-14 – 2023-02-15 (×2): 5 mg via ORAL
  Filled 2023-02-13 (×2): qty 1

## 2023-02-13 MED ORDER — DIPHENHYDRAMINE HCL 25 MG PO CAPS: 25.0000 mg | ORAL_CAPSULE | Freq: Four times a day (QID) | ORAL | Status: DC | PRN

## 2023-02-13 NOTE — Progress Notes (Signed)
OB Progress Note  S: feeling better after tylenol, comfortable in bed   O: Today's Vitals   02/13/23 0501 02/13/23 0531 02/13/23 0532 02/13/23 0551  BP: 126/71 130/73 130/73   Pulse: 99 (!) 102 (!) 102   Resp:      Temp:    99.3 F (37.4 C)  TempSrc:    Oral  SpO2:      Weight:      Height:      PainSc:       Body mass index is 31.6 kg/m.  SVE 7/80/0, IUPC placed  FHR:  145bpm, mod variability, + accels, some early/variable decels, currently resolved with position change Toco: ctx q 2-4 mins, MVUs 200   A/P:  27Y G1P0 @ [redacted]w[redacted]d, labor Fetal wellbeing: cat 1-2 tracing, overall reassuring Labor: s/p AROM, IUPC placed and patient is open to pitocin if MVUs inadequate, making slow change, expectant management, anticipate SVD Pain control: epidural in place Influenza A: droplet/contact precautions, tamiflu ordered. Fever earlier, resolved with Tylenol. Low suspicion for chorio,  antibiotics not indicated at this point but  will monitor closely.  Few elevated blood pressures in MAU, has been normotensive since: likely pain related, admission labs normal. Urine P/C 0.24. Will monitor.    Charlett Nose 02/13/23 6:32 AM

## 2023-02-13 NOTE — Progress Notes (Signed)
OB Progress Note  S: feeling fever/chills, temp 101.99F   O: Today's Vitals   02/13/23 0031 02/13/23 0101 02/13/23 0131 02/13/23 0205  BP: 124/75 120/71 125/81   Pulse: 88 89 (!) 110   Resp:      Temp:    (!) 101.3 F (38.5 C)  TempSrc:    Oral  SpO2:      Weight:      Height:      PainSc:       Body mass index is 31.6 kg/m.  SVE 6.5/80/0  FHR:  130bpm, mod variability, + accels, no decels Toco: ctx q 2-4 mins   A/P: 27Y G1P0 @ [redacted]w[redacted]d, labor Fetal wellbeing: cat I tracing Labor: s/p AROM, making slow change expectant management, anticipate SVD Pain control: epidural in place Influenza A: droplet/contact precautions, tamiflu ordered. Febrile, tylenol ordered. Unlikely to be chorio given ROM only 4 hours ago, antibiotics not indicated at this point but  will monitor closely.  Few elevated blood pressures in MAU, has been normotensive since: likely pain related, admission labs normal. Urine P/C 0.24. Will monitor.    Charlett Nose 02/13/23 2:29 AM

## 2023-02-13 NOTE — Lactation Note (Signed)
This note was copied from a baby's chart. Lactation Consultation Note  Patient Name: Gina Shepherd [id number] Date: 02/13/2023 Age: 29 hrs Reason for consult: Initial assessment;1st time breastfeeding;Primapara;Mother's request;Term  P1, 39 wks, @ 3 hrs of life. First work at breast. Demonstrated cross-cradle positioning and steps of latching. Mom takes over, encouraged couplet learning- Encouraged starting with hand expression and using breast compression to keep infant interested. Infant slips off nipple a couple times, demonstrated re-latching for mom. Infant works 10 minutes on breast. Encouraged 1st day- sleepy, 2nd day more awake, overnights cluster feeding to bring milk in. [facility] services, and milk storage shared with mom. Encouraged mom to call for lactation as desired.  Maternal Data Has patient been taught Hand Expression?: Yes Does the patient have breastfeeding experience prior to this delivery?: No  Feeding Mother's Current Feeding Choice: Breast Milk  LATCH Score Latch: Grasps breast easily, tongue down, lips flanged, rhythmical sucking.  Audible Swallowing: Spontaneous and intermittent  Type of Nipple: Everted at rest and after stimulation  Comfort (Breast/Nipple): Soft / non-tender  Hold (Positioning): Full assist, staff holds infant at breast  LATCH Score: 8   Lactation Tools Discussed/Used    Interventions Interventions: Breast feeding basics reviewed;Assisted with latch;Skin to skin;Hand express;Breast compression;Expressed milk;Education;LC Services brochure (Milk storage guidelines)  Discharge Pump: Personal;DEBP (Per mom has a pump @ home)  Consult Status Consult Status: Follow-up Date: 02/14/23 Follow-up type: In-patient    [facility] 02/13/2023, 4:32 PM

## 2023-02-14 LAB — CBC
HCT: 33 % — ABNORMAL LOW (ref 36.0–46.0)
Hemoglobin: 10.8 g/dL — ABNORMAL LOW (ref 12.0–15.0)
MCH: 28.7 pg (ref 26.0–34.0)
MCHC: 32.7 g/dL (ref 30.0–36.0)
MCV: 87.8 fL (ref 80.0–100.0)
Platelets: 167 10*3/uL (ref 150–400)
RBC: 3.76 MIL/uL — ABNORMAL LOW (ref 3.87–5.11)
RDW: 13.8 % (ref 11.5–15.5)
WBC: 11.9 10*3/uL — ABNORMAL HIGH (ref 4.0–10.5)
nRBC: 0 % (ref 0.0–0.2)

## 2023-02-14 NOTE — Lactation Note (Signed)
This note was copied from a baby's chart. Lactation Consultation Note  Patient Name: Gina Shepherd Date: 02/14/2023 Age:29 hours Reason for consult: Follow-up assessment;Primapara;1st time breastfeeding;Term;Infant weight loss (droplet precaution maintained) Last feeding was at 0945 for 15 mins. Per mom baby has slept since.  LC offered to see if the baby would feed, checked the diaper, dry. LC placed the baby STS on the left breast, football, and the baby baby latched on and off for 7-8 strong sucks and sleepy.  LC recommended trying in a hour, not more then 10 mins at a try.  LC reviewed the doc flow sheets with parents and WNL for age.    Maternal Data Has patient been taught Hand Expression?: Yes Does the patient have breastfeeding experience prior to this delivery?: No  Feeding Mother's Current Feeding Choice: Breast Milk  LATCH Score Latch: Repeated attempts needed to sustain latch, nipple held in mouth throughout feeding, stimulation needed to elicit sucking reflex.  Audible Swallowing: A few with stimulation  Type of Nipple: Everted at rest and after stimulation  Comfort (Breast/Nipple): Soft / non-tender  Hold (Positioning): Assistance needed to correctly position infant at breast and maintain latch.  LATCH Score: 7   Lactation Tools Discussed/Used  Hand pump   Interventions Interventions: Breast feeding basics reviewed;Assisted with latch;Skin to skin;Breast massage;Hand express;Breast compression;Adjust position;Support pillows;Position options;Hand pump;Education;LC Services brochure  Discharge Pump: Personal;DEBP;Manual  Consult Status Consult Status: Follow-up Date: 02/15/23 Follow-up type: In-patient    Gina Shepherd 02/14/2023, 12:58 PM

## 2023-02-14 NOTE — Anesthesia Postprocedure Evaluation (Signed)
Anesthesia Post Note  Patient: Gina Shepherd  Procedure(s) Performed: AN AD HOC LABOR EPIDURAL     Patient location during evaluation: Mother Baby Anesthesia Type: Epidural Level of consciousness: awake and alert Pain management: pain level controlled Vital Signs Assessment: post-procedure vital signs reviewed and stable Respiratory status: spontaneous breathing, nonlabored ventilation and respiratory function stable Cardiovascular status: stable Postop Assessment: no headache, no backache and epidural receding Anesthetic complications: no   No notable events documented.  Last Vitals:  Vitals:   02/13/23 2345 02/14/23 0550  BP:  (!) 105/59  Pulse:  70  Resp: 18 16  Temp:  (!) 36.4 C  SpO2:  99%    Last Pain:  Vitals:   02/14/23 1213  TempSrc:   PainSc: 5                  Mariann Barter

## 2023-02-14 NOTE — Progress Notes (Addendum)
POSTPARTUM PROGRESS NOTE  Post Partum Day #1  Subjective:  Patient currently showering during rounds. Per RN, no acute events overnight.  VSS overnight  Objective: Blood pressure (!) 105/59, pulse 70, temperature (!) 97.5 F (36.4 C), temperature source Oral, resp. rate 16, height 5\' 5"  (1.651 m), weight 86.1 kg, last menstrual period 04/10/2022, SpO2 99%.   Recent Labs    02/12/23 1828 02/14/23 0449  HGB 13.0 10.8*  HCT 38.4 33.0*    Assessment/Plan:  ASSESSMENT: Gina Shepherd is a 27 y.o. G1P0000 s/p SVD @ [redacted]w[redacted]d. 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 Depression stable on lexapro 5mg , Rh neg  1) postpartum - routine course. Continue lactation visits    *stool regimen given 3a laceration    *baby Aneg, Rhogam not indicated 2) circumcision - Contraindication 2/2 hypospadias, NICU aware 3) Flu pos - continue tamiflu course  Attempt to examine later after patient shower. Continue droplet precautions while in-house Plan for discharge tomorrow PPD#2   LOS: 2 days    ADDENDUM 1900: Examined at bedside  General: alert, cooperative and no distress Lochia:normal flow Chest: CTAB Heart: RRR no m/r/g Abdomen: +BS, soft, nontender Uterine Fundus: firm, 2cm below umbilicus GU: 3a suture intact, healing well, no purulent drainage, appropriate TTP Extremities: neg edema, neg calf TTP BL, neg Homans BL

## 2023-02-14 NOTE — Lactation Note (Signed)
This note was copied from a baby's chart. Lactation Consultation Note  Patient Name: Gina Shepherd [id number] Date: 02/14/2023 Age:29 hours Reason for consult: Follow-up assessment;Mother's request;Difficult latch;Term MOB requested latch assistance, MOB was given pillow support and MOB latched infant on her left breast using the cross cradle hold, infant was on and off the breast for 15 minutes but started sustaining his latch towards the end of the feeding. Afterwards MOB hand expressed and infant was given 8 mls of colostrum by spoon. MOB was taught hand expression earlier today. MOB will continue to work towards infant sustaining his latch at the breast, continue to BF infant by cues, on demand, every 2-3 hours, skin to skin. MOB knows if infant BF 1st breast and still cuing afterwards to offer the 2nd breast during the same feeding. MOB knows to call for further latch assistance if needed. LC reviewed importance of maternal rest, balance diet and hydration.   Maternal Data Has patient been taught Hand Expression?: Yes Does the patient have breastfeeding experience prior to this delivery?: No  Feeding Mother's Current Feeding Choice: Breast Milk  LATCH Score Latch: Repeated attempts needed to sustain latch, nipple held in mouth throughout feeding, stimulation needed to elicit sucking reflex.  Audible Swallowing: A few with stimulation  Type of Nipple: Everted at rest and after stimulation  Comfort (Breast/Nipple): Soft / non-tender  Hold (Positioning): Assistance needed to correctly position infant at breast and maintain latch.  LATCH Score: 7   Lactation Tools Discussed/Used    Interventions Interventions: Skin to skin;Assisted with latch;Breast compression;Adjust position;Support pillows;Position options;Expressed milk;Hand express;Education  Discharge    Consult Status Consult Status: Follow-up Date: 02/14/23 Follow-up type: In-patient    Frederico Hamman 02/14/2023, 1:55 AM

## 2023-02-14 NOTE — Social Work (Signed)
MOB was referred for history of depression/anxiety.  * Referral screened out by Clinical Social Worker because none of the following criteria appear to apply:  ~ History of anxiety/depression during this pregnancy, or of post-partum depression following prior delivery.  ~ Diagnosis of anxiety and/or depression within last 3 years OR * MOB's symptoms currently being treated with medication and/or therapy. Per chart review MOB stable on lexapro 5mg .  Please contact the Clinical Social Worker if needs arise, by [facility] request, or if MOB scores greater than 9/yes to question 10 on [place] Postpartum Depression Screen.  Wende Neighbors, LCSWA Clinical Social Worker [phone]

## 2023-02-15 LAB — SURGICAL PATHOLOGY

## 2023-02-15 MED ORDER — OSELTAMIVIR PHOSPHATE 75 MG PO CAPS: 75.0000 mg | ORAL_CAPSULE | Freq: Two times a day (BID) | ORAL | 0 refills | Status: AC

## 2023-02-15 MED ORDER — IBUPROFEN 600 MG PO TABS: 600.0000 mg | ORAL_TABLET | Freq: Four times a day (QID) | ORAL | Status: AC

## 2023-02-15 MED ORDER — ESCITALOPRAM OXALATE 5 MG PO TABS: 5.0000 mg | ORAL_TABLET | Freq: Every day | ORAL | Status: AC

## 2023-02-15 MED ORDER — ACETAMINOPHEN 325 MG PO TABS: 650.0000 mg | ORAL_TABLET | ORAL | Status: AC | PRN

## 2023-02-15 NOTE — Lactation Note (Signed)
This note was copied from a baby's chart.  NICU Lactation Consultation Note  Patient Name: Boy Najiyah Heinly [id number] Date: 02/15/2023 Age:29 hours  Reason for consult: Follow-up assessment; Primapara; 1st time breastfeeding; NICU baby; Term; Other (Comment); Maternal discharge (droplet precaution)  SUBJECTIVE Visited with family of 75 hours old FT NICU female; Ms. Matthias is a P1 and reported she started pumping this morning after NICU admission; she's already getting enough to collect, praised her for her efforts. She's getting discharged today. Provided Medela sanitizing spray and breastmilk labels, did 2-step verification with MBU NT Kat. Reviewed discharge education, pumping schedule, pumping log, pump settings, lactogenesis II/III and anticipatory guidelines.   OBJECTIVE Infant data: Mother's Current Feeding Choice: -- (NPO)  O2 Device: CPAP O2 Flow Rate (L/min): 5 L/min FiO2 (%): 21 %  Infant feeding assessment No data recorded  Maternal data: G1P0000 Vaginal, Spontaneous Has patient been taught Hand Expression?: Yes Hand Expression Comments: several small drops prior to latch Significant Breast History:: (+) breast changes during the pregnancy Current breast feeding challenges:: NICU admission Does the patient have breastfeeding experience prior to this delivery?: No Pumping frequency: 2 times/24 hours, baby was transferred to NICU this morning Pumped volume: 5 mL (drops sometimes) Flange Size: 21 Risk factor for low/delayed milk supply:: primipara. prematurity, infant separation  Pump: Personal (Lansinoh DEBP)  ASSESSMENT Infant: Feeding Status: NPO Feeding method: Breast  Maternal: Milk volume: Normal  INTERVENTIONS/PLAN Interventions: Interventions: Breast feeding basics reviewed; Coconut oil; DEBP; Education; NICU Pumping Log; CDC Guidelines for Breast Pump Cleaning Tools: Pump; Flanges; Coconut oil; Other (comment) (Medela sanitizing spray) Pump Education:  Setup, frequency, and cleaning; Milk Storage  Plan: Encouraged pumping every 3 hours, ideally 8 pumping sessions/24 hours Breast massage, hand expression and coconut oil were also encouraged prior pumping Parents will drop breastmilk at the security desk downstairs while on contact precautions  FOB present and supportive. All questions and concerns answered, family to contact [facility] services PRN.  Consult Status: NICU follow-up NICU Follow-up type: Verify onset of copious milk; Verify absence of engorgement   Niall Illes S Howard Patton 02/15/2023, 1:28 PM

## 2023-02-15 NOTE — Progress Notes (Signed)
POSTPARTUM PROGRESS NOTE  Post Partum Day #2  Subjective: Gina Shepherd is feeling well, although she is very sad that her baby had to be taken to the NICU due to breathing issues. She is sore in her perineum and has some uterine cramping but is otherwise feeling well. She has a mild cough from her flu, but no SOB. She is ambulating, tolerating PO, and voiding.   Objective:    02/15/2023    8:25 AM 02/14/2023   11:28 PM 02/14/2023    1:45 PM  Vitals with BMI  Systolic 124 115 161  Diastolic 76 75 64  Pulse 88 73 77    General: alert, cooperative and no distress Lochia:normal flow Chest: CTAB Heart: RRR no m/r/g Abdomen: soft, nontender Uterine Fundus: firm, 2cm below umbilicus Extremities: neg edema, neg calf TTP BL   Recent Labs    02/12/23 1828 02/14/23 0449  HGB 13.0 10.8*  HCT 38.4 33.0*    Assessment/Plan:  ASSESSMENT: DANNIELLA Shepherd is a 27 y.o. G1 now P1001 s/p SVD @ [redacted]w[redacted]d. 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  Rh neg  1) postpartum - routine course. Continue lactation visits    *stool regimen given 3a laceration    *baby Aneg, Rhogam not indicated 2) circumcision - Contraindication 2/2 hypospadias, NICU aware 3) Flu pos - continue tamiflu course 4) Depression stable on lexapro 5mg : plan postpartum mood check.  Continue droplet precautions while in-house Plan for discharge today   LOS: 3 days

## 2023-02-15 NOTE — Discharge Summary (Signed)
Postpartum Discharge Summary  Date of Service updated 12/16-12/19     Patient Name: Gina Shepherd DOB: 11-25-1995 MRN: [id number]  Date of admission: 02/12/2023 Delivery date:02/13/2023 Delivering provider: Waynard Reeds Date of discharge: 02/15/2023  Admitting diagnosis: Uterine contractions [O47.9] Spontaneous vaginal delivery [O80] Intrauterine pregnancy: [redacted]w[redacted]d     Secondary diagnosis:  Principal Problem:   Uterine contractions Active Problems:   Spontaneous vaginal delivery  Additional problems: Rh neg, flu positive, depression    Discharge diagnosis: Term Pregnancy Delivered                                              Post partum procedures: none Augmentation: AROM Complications: 3a laceration  Hospital course: Onset of Labor With Vaginal Delivery      27 y.o. yo G1P0000 at [redacted]w[redacted]d was admitted in Latent Labor on 02/12/2023. Labor course was complicated by flu  Membrane Rupture Time/Date: 10:15 PM,02/12/2023  Delivery Method:Vaginal, Spontaneous Operative Delivery:N/A Episiotomy: None Lacerations:  3rd degree Patient had a postpartum course complicated by flu.  She is ambulating, tolerating a regular diet, passing flatus, and urinating well. Patient is discharged home in stable condition on 02/15/23.  Newborn Data: Birth date:02/13/2023 Birth time:12:22 PM Gender:Female Living status:Living Apgars:6 ,8  Weight:2860 g  Magnesium Sulfate received: No BMZ received: No Rhophylac:No MMR:No T-DaP:Given prenatally Flu: Yes RSV Vaccine received: Yes Transfusion:No Immunizations administered: Immunization History  Administered Date(s) Administered   HPV 9-valent 04/13/2015, 06/10/2015, 04/14/2016    Physical exam  Vitals:   02/14/23 0550 02/14/23 1345 02/14/23 2328 02/15/23 0825  BP: (!) 105/59 107/64 115/75 124/76  Pulse: 70 77 73 88  Resp: 16 16 16 18   Temp: (!) 97.5 F (36.4 C) 98 F (36.7 C) 97.9 F (36.6 C) 98.3 F (36.8 C)  TempSrc: Oral  Oral Oral Oral  SpO2: 99% 100% 98% 99%  Weight:      Height:       General: alert, cooperative, and no distress Lochia: appropriate Uterine Fundus: firm Incision: N/A DVT Evaluation: No evidence of DVT seen on physical exam. Labs: Lab Results  Component Value Date   WBC 11.9 (H) 02/14/2023   HGB 10.8 (L) 02/14/2023   HCT 33.0 (L) 02/14/2023   MCV 87.8 02/14/2023   PLT 167 02/14/2023      Latest Ref Rng & Units 02/12/2023    6:28 PM  CMP  Glucose 70 - 99 mg/dL 93   BUN 6 - 20 mg/dL 7   Creatinine 9.52 - 8.41 mg/dL 3.24   Sodium 401 - 027 mmol/L 137   Potassium 3.5 - 5.1 mmol/L 3.7   Chloride 98 - 111 mmol/L 107   CO2 22 - 32 mmol/L 20   Calcium 8.9 - 10.3 mg/dL 9.0   Total Protein 6.5 - 8.1 g/dL 6.5   Total Bilirubin <2.5 mg/dL 0.6   Alkaline Phos 38 - 126 U/L 184   AST 15 - 41 U/L 40   ALT 0 - 44 U/L 22    Edinburgh Score:    02/14/2023   12:13 PM  Edinburgh Postnatal Depression Scale Screening Tool  I have been able to laugh and see the funny side of things. 0  I have looked forward with enjoyment to things. 0  I have blamed myself unnecessarily when things went wrong. 2  I have been anxious or worried for  no good reason. 2  I have felt scared or panicky for no good reason. 2  Things have been getting on top of me. 1  I have been so unhappy that I have had difficulty sleeping. 0  I have felt sad or miserable. 0  I have been so unhappy that I have been crying. 1  The thought of harming myself has occurred to me. 0  Edinburgh Postnatal Depression Scale Total 8      After visit meds:  Allergies as of 02/15/2023       Reactions   Benzoyl Peroxide Other (See Comments)   Epiduo [adapalene-benzoyl Peroxide] Swelling        Medication List     TAKE these medications    acetaminophen 325 MG tablet Commonly known as: Tylenol Take 2 tablets (650 mg total) by mouth every 4 (four) hours as needed (for pain scale < 4).   escitalopram 5 MG  tablet Commonly known as: LEXAPRO Take 1 tablet (5 mg total) by mouth daily. Start taking on: February 16, 2023 What changed:  medication strength how much to take   ibuprofen 600 MG tablet Commonly known as: ADVIL Take 1 tablet (600 mg total) by mouth every 6 (six) hours.   oseltamivir 75 MG capsule Commonly known as: TAMIFLU Take 1 capsule (75 mg total) by mouth 2 (two) times daily for 3 days.   PRENATAL PO Take by mouth.         Discharge home in stable condition Infant Feeding:  pumping for baby in NICU Infant Disposition:NICU Discharge instruction: per After Visit Summary and Postpartum booklet. Activity: Advance as tolerated. Pelvic rest for 6 weeks.  Diet: routine diet Anticipated Birth Control: Unsure Postpartum Appointment:4 weeks Additional Postpartum F/U: Postpartum Depression checkup Future Appointments:No future appointments. Follow up Visit:  Follow-up Information     Ob/Gyn, Nestor Ramp. Schedule an appointment as soon as possible for a visit in 4 week(s).   Contact information: [address] [place] [address] [phone]                     02/15/2023 Willa Frater, MD

## 2023-02-16 ENCOUNTER — Ambulatory Visit (HOSPITAL_COMMUNITY): Payer: Self-pay

## 2023-02-16 ENCOUNTER — Emergency Department
Admission: EM | Admit: 2023-02-16 | Discharge: 2023-02-16 | Disposition: A | Discharge status: Home / Self Care | Payer: 59 | Attending: Emergency Medicine | Admitting: Emergency Medicine

## 2023-02-16 ENCOUNTER — Other Ambulatory Visit: Payer: Self-pay

## 2023-02-16 DIAGNOSIS — R220 Localized swelling, mass and lump, head: Secondary | ICD-10-CM | POA: Insufficient documentation

## 2023-02-16 DIAGNOSIS — R591 Generalized enlarged lymph nodes: Secondary | ICD-10-CM | POA: Diagnosis not present

## 2023-02-16 LAB — BASIC METABOLIC PANEL
Anion gap: 9 (ref 5–15)
BUN: 12 mg/dL (ref 6–20)
CO2: 23 mmol/L (ref 22–32)
Calcium: 8.7 mg/dL — ABNORMAL LOW (ref 8.9–10.3)
Chloride: 109 mmol/L (ref 98–111)
Creatinine, Ser: 0.55 mg/dL (ref 0.44–1.00)
GFR, Estimated: >60 mL/min (ref >60)
Glucose, Bld: 95 mg/dL (ref 70–99)
Potassium: 3.9 mmol/L (ref 3.5–5.1)
Sodium: 141 mmol/L (ref 135–145)

## 2023-02-16 LAB — CBC
HCT: 36.5 % (ref 36.0–46.0)
Hemoglobin: 11.9 g/dL — ABNORMAL LOW (ref 12.0–15.0)
MCH: 29.2 pg (ref 26.0–34.0)
MCHC: 32.6 g/dL (ref 30.0–36.0)
MCV: 89.5 fL (ref 80.0–100.0)
Platelets: 241 10*3/uL (ref 150–400)
RBC: 4.08 MIL/uL (ref 3.87–5.11)
RDW: 13.4 % (ref 11.5–15.5)
WBC: 8.2 10*3/uL (ref 4.0–10.5)
nRBC: 0 % (ref 0.0–0.2)

## 2023-02-16 NOTE — ED Triage Notes (Addendum)
Pt presents to ER with c/o right lower mandibular pain and swelling that started yesterday PM and has been getting worse.  Pt has some tenderness and swelling noted on palpation to right lower jaw.  States she gave birth 2 days ago, and has also had the flu.   Pt also reports dealing with extreme anxiety and difficulty sleeping following her delivery and is requesting some help with this issue.  Pt is otherwise A&O x4 and in NAD in triage.

## 2023-02-16 NOTE — Lactation Note (Signed)
This note was copied from a baby's chart. Lactation Consultation Note  Patient Name: Gina Shepherd [id number] Date: 02/16/2023 Age:29 hours   LC received a call from Heide Guile RN (baby's nurse today) asking for [facility] to call Mom.    Mom had misplaced a connector part to her home pump (Lansinoh) after washing and sanitizing, and her hand pump wasn't functioning properly.  LC went over process and how to possibly fix the hand pump.  Mom notified of pump rental available in hospital gift shop.  Mom has her pump parts from her Medela Symphony she was using.  Engorgement treatment reviewed.  Mom encouraged to do breast massage and hand expression frequently until she obtains the rental pump.  Mom encouraged to start her consistent pumping with her DEBP when received.  Mom to call back prn for concerns or support needed.   [name] 02/16/2023, 10:24 AM

## 2023-02-16 NOTE — Discharge Instructions (Addendum)
I think the swelling is most likely this is an enlarged lymph node from your recent influenza infection.   You can take Motrin 400 mg every 6 hours as needed for anti-inflammatory properties and pain relief.  In case this is a salivary gland stone, you can take sour candies that can help relieve the obstruction.  As we discussed, if you develop any new symptoms such as severe pain, high fever, enlarging mass, difficulty swallowing or breathing or voice changes then come back to the emergency department immediately.  Thank you for choosing [place] for your health care today!  Please see your primary doctor this week for a follow up appointment.   In terms of your anxiety, I do not want to prescribe you hydroxyzine as we described because I am uncertain about its safety for breast-feeding.  Please discuss with your primary doctor about safe alternatives to take.  If you have any new, worsening, or unexpected symptoms call your doctor right away or come back to the emergency department for reevaluation.  It was my pleasure to care for you today.   Daneil Dan Modesto Charon, MD

## 2023-02-16 NOTE — ED Provider Notes (Addendum)
[facility] Provider Note    Event Date/Time   First MD Initiated Contact with Patient 02/16/23 [phone]     (approximate)   History   Facial Swelling   HPI  Gina Shepherd is a 27 y.o. female   Past medical history of depression and anxiety and recently gave birth a couple of days ago full-term vaginal delivery uncomplicated but her child was in the NICU for respiratory symptoms and she has been stressed due to this and recent illnesses diagnosed with influenza as both she and her husband experienced viral URI symptoms just days prior to labor and delivery.  Today her chief complaint is that she developed some submandibular swelling on the right side.  There is a painful mass.  There is no airway involvement she is breathing comfortably and able to maintain secretions.  She has no fever.  Her URI symptoms have mostly resolved.   Independent Historian contributed to assessment above: Husband at bedside to corroborate information past medical history as above    Physical Exam   Triage Vital Signs: ED Triage Vitals  Encounter Vitals Group     BP 02/16/23 0327 (!) 146/96     Systolic BP Percentile --      Diastolic BP Percentile --      Pulse Rate 02/16/23 0327 91     Resp 02/16/23 0327 20     Temp 02/16/23 0327 98.1 F (36.7 C)     Temp Source 02/16/23 0327 Oral     SpO2 02/16/23 0327 99 %     Weight --      Height --      Head Circumference --      Peak Flow --      Pain Score 02/16/23 0332 3     Pain Loc --      Pain Education --      Exclude from Growth Chart --     Most recent vital signs: Vitals:   02/16/23 0327 02/16/23 0514  BP: (!) 146/96   Pulse: 91   Resp: 20   Temp: 98.1 F (36.7 C) 98.4 F (36.9 C)  SpO2: 99%     General: Awake, no distress.  CV:  Good peripheral perfusion.  Resp:  Normal effort.  Abd:  No distention.  Other:  There is a swollen mobile mildly tender mass to the right submandibular area does not cross  midline, no intraoral lesions, maintaining airway with normal phonation, maintaining secretions, no respiratory distress.   ED Results / Procedures / Treatments   Labs (all labs ordered are listed, but only abnormal results are displayed) Labs Reviewed  CBC - Abnormal; Notable for the following components:      Result Value   Hemoglobin 11.9 (*)    All other components within normal limits  BASIC METABOLIC PANEL - Abnormal; Notable for the following components:   Calcium 8.7 (*)    All other components within normal limits     I ordered and reviewed the above labs they are notable for white blood cell count is normal   PROCEDURES:  Critical Care performed: No  Procedures   MEDICATIONS ORDERED IN ED: Medications - No data to display   IMPRESSION / MDM / ASSESSMENT AND PLAN / ED COURSE  I reviewed the triage vital signs and the nursing notes.  Patient's presentation is most consistent with acute presentation with potential threat to life or bodily function.  Differential diagnosis includes, but is not limited to, reactive lymphadenopathy due to recent URI, malignancy, sialolithiasis, abscess considered but less likely Ludwig or airway compromise   MDM: This is most likely lymphadenopathy in the setting of recent viral URI symptoms.  Does not look like Ludwick's and certainly does not compromise airway.  Considered sialolithiasis.  Considered abscess.  These are less likely given her symptomatology exam and timeline in relation to her recent influenza make reactive lymph nodes much more likely.  I discussed with the patient options for imaging including ultrasound or CT scan, but will defer at this time but understands to return with any new or worsening symptoms.  Anticipatory guidance was given.  She also asks about anxiety treatment given her baseline anxiety worsened by stressors regarding her recent labor and delivery and complicated  course for her newborn child.  She will continue taking her anxiety medications prescribed by her primary doctor and we will defer on any Atarax as this has uncertain safety profile given her breast-feeding state.  She will follow-up with PMD for further guidance.      FINAL CLINICAL IMPRESSION(S) / ED DIAGNOSES   Final diagnoses:  Submandibular swelling  Lymphadenopathy     Rx / DC Orders   ED Discharge Orders     None        Note:  This document was prepared using Dragon voice recognition software and may include unintentional dictation errors.    Pilar Jarvis, MD 02/16/23 [id number]    Pilar Jarvis, MD 02/16/23 [phone]

## 2023-02-19 ENCOUNTER — Inpatient Hospital Stay (HOSPITAL_COMMUNITY): Admission: AD | Admit: 2023-02-19 | Payer: 59 | Source: Home / Self Care | Admitting: Obstetrics and Gynecology

## 2023-02-19 ENCOUNTER — Inpatient Hospital Stay (HOSPITAL_COMMUNITY): Payer: 59

## 2023-02-20 ENCOUNTER — Telehealth (HOSPITAL_COMMUNITY): Payer: Self-pay | Admitting: *Deleted

## 2023-02-20 ENCOUNTER — Ambulatory Visit (HOSPITAL_COMMUNITY): Payer: Self-pay

## 2023-02-20 NOTE — Lactation Note (Signed)
This note was copied from a baby'Gina Shepherd chart.  NICU Lactation Consultation Note  Patient Name: Gina Shepherd [id number] Date: 02/20/2023 Age:29 days  Reason for consult: Weekly NICU follow-up; Primapara; 1st time breastfeeding; NICU baby; Term  SUBJECTIVE Visited with family of 29 days old FT NICU female; Gina Shepherd is a P1 and reports she'Gina Shepherd pumping consistently and her supply continues to increase, praised her for her efforts. She complains of sensitivity on her L side (see maternal assessment). She recently started taking baby "Excell Seltzer" to breast but wasn't able to stay for the next feeding because parents are taking baby home today.  Reviewed discharge education and the importance of consistent pumping after feedings/attempts at the breast to protect her supply and for the prevention of engorgement. She'Gina Shepherd aware that current plan will need some modifications after her calibration period of 2 weeks is over, referral to [facility] sent for [facility] OP F/U. FOB present. All questions and concerns answered, family to contact [facility] services PRN.  OBJECTIVE Infant data: Mother'Gina Shepherd Current Feeding Choice: Breast Milk  O2 Device: Room Air  Infant feeding assessment IDFTS - Readiness: 1 IDFTS - Quality: 2   Maternal data: G1P0000 Vaginal, Spontaneous Pumping frequency: 6-7 times/24 hours Pumped volume: 150 mL Flange Size: 21  Pump: Personal (Lansinoh DEBP)  ASSESSMENT Infant: Feeding Status: Ad lib Feeding method: Bottle Nipple Type: Dr. 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

## 2023-02-20 NOTE — Telephone Encounter (Signed)
Attempted hospital discharge follow-up call. Left message for patient to return RN call with any questions or concerns. Deforest Hoyles, RN, 02/20/23, [phone]

## 2023-05-24 DIAGNOSIS — M6289 Other specified disorders of muscle: Secondary | ICD-10-CM | POA: Diagnosis not present

## 2023-05-24 DIAGNOSIS — M6281 Muscle weakness (generalized): Secondary | ICD-10-CM | POA: Diagnosis not present

## 2023-05-24 DIAGNOSIS — N3946 Mixed incontinence: Secondary | ICD-10-CM | POA: Diagnosis not present

## 2023-05-24 DIAGNOSIS — M62838 Other muscle spasm: Secondary | ICD-10-CM | POA: Diagnosis not present

## 2023-06-10 ENCOUNTER — Encounter: Payer: Self-pay | Admitting: Nurse Practitioner

## 2023-07-11 DIAGNOSIS — M62838 Other muscle spasm: Secondary | ICD-10-CM | POA: Diagnosis not present

## 2023-07-11 DIAGNOSIS — N3946 Mixed incontinence: Secondary | ICD-10-CM | POA: Diagnosis not present

## 2023-07-11 DIAGNOSIS — M6281 Muscle weakness (generalized): Secondary | ICD-10-CM | POA: Diagnosis not present

## 2023-07-11 DIAGNOSIS — N941 Unspecified dyspareunia: Secondary | ICD-10-CM | POA: Diagnosis not present

## 2023-08-27 DIAGNOSIS — D2239 Melanocytic nevi of other parts of face: Secondary | ICD-10-CM | POA: Diagnosis not present

## 2023-09-04 DIAGNOSIS — N941 Unspecified dyspareunia: Secondary | ICD-10-CM | POA: Diagnosis not present

## 2023-09-04 DIAGNOSIS — M6281 Muscle weakness (generalized): Secondary | ICD-10-CM | POA: Diagnosis not present

## 2023-09-04 DIAGNOSIS — M62838 Other muscle spasm: Secondary | ICD-10-CM | POA: Diagnosis not present

## 2023-09-04 DIAGNOSIS — R102 Pelvic and perineal pain: Secondary | ICD-10-CM | POA: Diagnosis not present

## 2023-09-18 DIAGNOSIS — M62838 Other muscle spasm: Secondary | ICD-10-CM | POA: Diagnosis not present

## 2023-09-18 DIAGNOSIS — R102 Pelvic and perineal pain: Secondary | ICD-10-CM | POA: Diagnosis not present

## 2023-09-18 DIAGNOSIS — M6281 Muscle weakness (generalized): Secondary | ICD-10-CM | POA: Diagnosis not present

## 2023-09-18 DIAGNOSIS — N3946 Mixed incontinence: Secondary | ICD-10-CM | POA: Diagnosis not present

## 2023-10-09 DIAGNOSIS — M6289 Other specified disorders of muscle: Secondary | ICD-10-CM | POA: Diagnosis not present

## 2023-10-09 DIAGNOSIS — K5900 Constipation, unspecified: Secondary | ICD-10-CM | POA: Diagnosis not present

## 2023-10-09 DIAGNOSIS — N3946 Mixed incontinence: Secondary | ICD-10-CM | POA: Diagnosis not present

## 2023-10-09 DIAGNOSIS — R102 Pelvic and perineal pain: Secondary | ICD-10-CM | POA: Diagnosis not present

## 2023-12-24 DIAGNOSIS — N9411 Superficial (introital) dyspareunia: Secondary | ICD-10-CM | POA: Diagnosis not present

## 2023-12-24 DIAGNOSIS — N952 Postmenopausal atrophic vaginitis: Secondary | ICD-10-CM | POA: Diagnosis not present

## 2023-12-28 DIAGNOSIS — L989 Disorder of the skin and subcutaneous tissue, unspecified: Secondary | ICD-10-CM | POA: Diagnosis not present

## 2024-01-21 DIAGNOSIS — Z0000 Encounter for general adult medical examination without abnormal findings: Secondary | ICD-10-CM | POA: Diagnosis not present

## 2024-01-21 DIAGNOSIS — F419 Anxiety disorder, unspecified: Secondary | ICD-10-CM | POA: Diagnosis not present

## 2024-01-21 DIAGNOSIS — F339 Major depressive disorder, recurrent, unspecified: Secondary | ICD-10-CM | POA: Diagnosis not present

## 2024-02-12 DIAGNOSIS — N9411 Superficial (introital) dyspareunia: Secondary | ICD-10-CM | POA: Diagnosis not present
